# Patient Record
Sex: Male | Born: 1965 | Race: Black or African American | Hispanic: No | Smoking: Current every day smoker
Health system: Southern US, Community
[De-identification: ages and names within clinical notes are randomized; demographics above are authoritative.]

## PROBLEM LIST (undated history)

## (undated) DIAGNOSIS — R2242 Localized swelling, mass and lump, left lower limb: Secondary | ICD-10-CM

## (undated) DIAGNOSIS — R5383 Other fatigue: Secondary | ICD-10-CM

## (undated) DIAGNOSIS — R42 Dizziness and giddiness: Secondary | ICD-10-CM

## (undated) DIAGNOSIS — I10 Essential (primary) hypertension: Secondary | ICD-10-CM

## (undated) HISTORY — PX: LEG SURGERY: SHX1003

## (undated) HISTORY — DX: Localized swelling, mass and lump, left lower limb: R22.42

## (undated) HISTORY — DX: Dizziness and giddiness: R42

## (undated) HISTORY — DX: Other fatigue: R53.83

---

## 2007-12-15 ENCOUNTER — Inpatient Hospital Stay (HOSPITAL_COMMUNITY): Admission: EM | Admit: 2007-12-15 | Discharge: 2007-12-21 | Payer: Self-pay | Admitting: Emergency Medicine

## 2009-09-02 ENCOUNTER — Emergency Department (HOSPITAL_COMMUNITY): Admission: EM | Admit: 2009-09-02 | Discharge: 2009-09-02 | Payer: Self-pay | Admitting: Emergency Medicine

## 2010-03-10 ENCOUNTER — Observation Stay (HOSPITAL_COMMUNITY): Admission: EM | Admit: 2010-03-10 | Discharge: 2010-03-10 | Payer: Self-pay | Admitting: Emergency Medicine

## 2010-09-25 LAB — CBC
HCT: 40.8 % (ref 39.0–52.0)
Hemoglobin: 14.2 g/dL (ref 13.0–17.0)
MCH: 29.8 pg (ref 26.0–34.0)
RDW: 13.7 % (ref 11.5–15.5)
WBC: 7.6 10*3/uL (ref 4.0–10.5)

## 2010-09-25 LAB — BASIC METABOLIC PANEL
BUN: 15 mg/dL (ref 6–23)
CO2: 25 mEq/L (ref 19–32)
Chloride: 105 mEq/L (ref 96–112)
Creatinine, Ser: 0.94 mg/dL (ref 0.4–1.5)
Potassium: 3.6 mEq/L (ref 3.5–5.1)
Sodium: 137 mEq/L (ref 135–145)

## 2010-09-25 LAB — POCT CARDIAC MARKERS
CKMB, poc: 1.5 ng/mL (ref 1.0–8.0)
Myoglobin, poc: 79.2 ng/mL (ref 12–200)
Myoglobin, poc: 83.7 ng/mL (ref 12–200)
Troponin i, poc: <0.05 ng/mL (ref 0.00–0.09)
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
## 2011-04-08 LAB — COMPREHENSIVE METABOLIC PANEL
ALT: 37
BUN: 7
Calcium: 9.5
Chloride: 101
Creatinine, Ser: 1.14
GFR calc non Af Amer: >60
Glucose, Bld: 132 — ABNORMAL HIGH
Sodium: 139

## 2011-04-08 LAB — DIFFERENTIAL
Eosinophils Absolute: 0.1
Lymphocytes Relative: 15
Neutro Abs: 9.3 — ABNORMAL HIGH

## 2011-04-08 LAB — CBC
HCT: 46.3
Hemoglobin: 15.7
RBC: 5.18
RDW: 14.6

## 2011-10-24 ENCOUNTER — Emergency Department (HOSPITAL_COMMUNITY)
Admission: EM | Admit: 2011-10-24 | Discharge: 2011-10-24 | Disposition: A | Discharge status: Home / Self Care | Payer: Self-pay | Attending: Emergency Medicine | Admitting: Emergency Medicine

## 2011-10-24 ENCOUNTER — Encounter (HOSPITAL_COMMUNITY): Payer: Self-pay | Admitting: *Deleted

## 2011-10-24 ENCOUNTER — Emergency Department (HOSPITAL_COMMUNITY): Payer: Self-pay

## 2011-10-24 DIAGNOSIS — R109 Unspecified abdominal pain: Secondary | ICD-10-CM | POA: Insufficient documentation

## 2011-10-24 DIAGNOSIS — I10 Essential (primary) hypertension: Secondary | ICD-10-CM | POA: Insufficient documentation

## 2011-10-24 DIAGNOSIS — K649 Unspecified hemorrhoids: Secondary | ICD-10-CM | POA: Insufficient documentation

## 2011-10-24 HISTORY — DX: Essential (primary) hypertension: I10

## 2011-10-24 MED ORDER — POLYETHYLENE GLYCOL 3350 17 GM/SCOOP PO POWD: 17.0000 g | Freq: Every day | ORAL | Status: AC

## 2011-10-24 NOTE — Discharge Instructions (Signed)
Abdominal Pain Abdominal pain can be caused by many things. Your caregiver decides the seriousness of your pain by an examination and possibly blood tests and X-rays. Many cases can be observed and treated at home. Most abdominal pain is not caused by a disease and will probably improve without treatment. However, in many cases, more time must pass before a clear cause of the pain can be found. Before that point, it may not be known if you need more testing, or if hospitalization or surgery is needed. HOME CARE INSTRUCTIONS   Do not take laxatives unless directed by your caregiver.   Take pain medicine only as directed by your caregiver.   Only take over-the-counter or prescription medicines for pain, discomfort, or fever as directed by your caregiver.   Try a clear liquid diet (broth, tea, or water) for as long as directed by your caregiver. Slowly move to a bland diet as tolerated.  SEEK IMMEDIATE MEDICAL CARE IF:   The pain does not go away.   You have a fever.   You keep throwing up (vomiting).   The pain is felt only in portions of the abdomen. Pain in the right side could possibly be appendicitis. In an adult, pain in the left lower portion of the abdomen could be colitis or diverticulitis.   You pass bloody or black tarry stools.  MAKE SURE YOU:   Understand these instructions.   Will watch your condition.   Will get help right away if you are not doing well or get worse.  Document Released: 04/07/2005 Document Revised: 06/17/2011 Document Reviewed: 02/14/2008 [facility] Patient Information 2012 [place], [place].Hemorrhoids Hemorrhoids are enlarged (dilated) veins around the rectum. There are 2 types of hemorrhoids, and the type of hemorrhoid is determined by its location. Internal hemorrhoids occur in the veins just inside the rectum.They are usually not painful, but they may bleed.However, they may poke through to the outside and become irritated and painful. External  hemorrhoids involve the veins outside the anus and can be felt as a painful swelling or hard lump near the anus.They are often itchy and may crack and bleed. Sometimes clots will form in the veins. This makes them swollen and painful. These are called thrombosed hemorrhoids. CAUSES Causes of hemorrhoids include:  Pregnancy. This increases the pressure in the hemorrhoidal veins.   Constipation.   Straining to have a bowel movement.   Obesity.   Heavy lifting or other activity that caused you to strain.  TREATMENT Most of the time hemorrhoids improve in 1 to 2 weeks. However, if symptoms do not seem to be getting better or if you have a lot of rectal bleeding, your caregiver may perform a procedure to help make the hemorrhoids get smaller or remove them completely.Possible treatments include:  Rubber band ligation. A rubber band is placed at the base of the hemorrhoid to cut off the circulation.   Sclerotherapy. A chemical is injected to shrink the hemorrhoid.   Infrared light therapy. Tools are used to burn the hemorrhoid.   Hemorrhoidectomy. This is surgical removal of the hemorrhoid.  HOME CARE INSTRUCTIONS   Increase fiber in your diet. Ask your caregiver about using fiber supplements.   Drink enough water and fluids to keep your urine clear or pale yellow.   Exercise regularly.   Go to the bathroom when you have the urge to have a bowel movement. Do not wait.   Avoid straining to have bowel movements.   Keep the anal area dry and  clean.   Only take over-the-counter or prescription medicines for pain, discomfort, or fever as directed by your caregiver.  If your hemorrhoids are thrombosed:  Take warm sitz baths for 20 to 30 minutes, 3 to 4 times per day.   If the hemorrhoids are very tender and swollen, place ice packs on the area as tolerated. Using ice packs between sitz baths may be helpful. Fill a plastic bag with ice. Place a towel between the bag of ice and your  skin.   Medicated creams and suppositories may be used or applied as directed.   Do not use a donut-shaped pillow or sit on the toilet for long periods. This increases blood pooling and pain.  SEEK MEDICAL CARE IF:   You have increasing pain and swelling that is not controlled with your medicine.   You have uncontrolled bleeding.   You have difficulty or you are unable to have a bowel movement.   You have pain or inflammation outside the area of the hemorrhoids.   You have chills or an oral temperature above 102 F (38.9 C).  MAKE SURE YOU:   Understand these instructions.   Will watch your condition.   Will get help right away if you are not doing well or get worse.  Document Released: 06/25/2000 Document Revised: 06/17/2011 Document Reviewed: 10/31/2007 [facility] Patient Information 2012 [place], [place].

## 2011-10-24 NOTE — ED Notes (Signed)
Bilateral lower belly pain, and knot on anal area. Duration x 1 week.

## 2011-10-24 NOTE — ED Provider Notes (Signed)
History     CSN: [id number]  Arrival date & time 10/24/11  1432   First MD Initiated Contact with Patient 10/24/11 1646      No chief complaint on file.   (Consider location/radiation/quality/duration/timing/severity/associated sxs/prior treatment) The history is provided by the patient.   patient's abdominal pain on and off the last few weeks. States it comes and goes. It is not worse with food or eating. It does not change with bowel movements. No diarrhea constipation. No nausea vomiting. No trauma. No rash. He also states he hasn't bump in his anal area. Assessment 1 on for about a week. Is not tender. No bleeding. No change in his bowel habits. He has not had this before.  Past Medical History  Diagnosis Date  . Hypertension     No past surgical history on file.  No family history on file.  History  Substance Use Topics  . Smoking status: Current Everyday Smoker  . Smokeless tobacco: Not on file  . Alcohol Use: Yes      Review of Systems  Constitutional: Negative for activity change and appetite change.  HENT: Negative for neck stiffness.   Eyes: Negative for pain.  Respiratory: Negative for chest tightness and shortness of breath.   Cardiovascular: Negative for chest pain and leg swelling.  Gastrointestinal: Positive for abdominal pain. Negative for nausea, vomiting and diarrhea.  Genitourinary: Negative for flank pain.  Musculoskeletal: Negative for back pain.  Skin: Negative for rash.  Neurological: Negative for weakness, numbness and headaches.  Psychiatric/Behavioral: Negative for behavioral problems.    Allergies  Review of patient's allergies indicates no known allergies.  Home Medications   Current Outpatient Rx  Name Route Sig Dispense Refill  . POLYETHYLENE GLYCOL 3350 PO POWD Oral Take 17 g by mouth daily. 255 g 0    BP 138/84  Pulse 79  Temp(Src) 98.2 F (36.8 C) (Oral)  Resp 12  SpO2 98%  Physical Exam  Nursing note and vitals  reviewed. Constitutional: He is oriented to person, place, and time. He appears well-developed and well-nourished.  HENT:  Head: Normocephalic and atraumatic.  Eyes: EOM are normal. Pupils are equal, round, and reactive to light.  Neck: Normal range of motion. Neck supple.  Cardiovascular: Normal rate, regular rhythm and normal heart sounds.   No murmur heard. Pulmonary/Chest: Effort normal and breath sounds normal.  Abdominal: Soft. Bowel sounds are normal. He exhibits no distension and no mass. There is no tenderness. There is no rebound and no guarding.  Genitourinary:       Patient has one approximately 1 cm thrombosed hemorrhoid versus mass. It is more of a white color. It is located anterior to the anus  Musculoskeletal: Normal range of motion. He exhibits no edema.  Neurological: He is alert and oriented to person, place, and time. No cranial nerve deficit.  Skin: Skin is warm and dry.  Psychiatric: He has a normal mood and affect.    ED Course  Procedures (including critical care time)  Labs Reviewed - No data to display Dg Abd 2 Views  10/24/2011  *RADIOLOGY REPORT*  Clinical Data: Bilateral abdominal pain for 2 months.  ABDOMEN - 2 VIEW  Comparison: None.  Findings: The bowel gas pattern is normal.  There is no evidence of free intraperitoneal air.  Faint vascular calcification is noted in the left pelvis.  There are no suspicious pelvic calcifications. There are possible postsurgical changes in the lower lumbar spine. Calcification is noted adjacent to the  left ischial tuberosity.  IMPRESSION: No acute abdominal findings.  Original Report Authenticated By: Gerrianne Scale, M.D.     1. Abdominal pain   2. Hemorrhoid       MDM  Patient with lower abdominal pain. Comes and goes. Possible mild constipation x-ray. Benign exam. Also has a mass in the area. Possible hemorrhoid, but the color is a little bit whiter than I would  expect. Patient will follow up with general  surgery        Juliet Rude. Rubin Payor, MD 10/24/11 [phone]

## 2013-02-12 ENCOUNTER — Emergency Department (HOSPITAL_COMMUNITY)
Admission: EM | Admit: 2013-02-12 | Discharge: 2013-02-13 | Disposition: A | Discharge status: Home / Self Care | Payer: No Typology Code available for payment source | Attending: Emergency Medicine | Admitting: Emergency Medicine

## 2013-02-12 ENCOUNTER — Encounter (HOSPITAL_COMMUNITY): Payer: Self-pay

## 2013-02-12 DIAGNOSIS — IMO0002 Reserved for concepts with insufficient information to code with codable children: Secondary | ICD-10-CM | POA: Insufficient documentation

## 2013-02-12 DIAGNOSIS — Y92410 Unspecified street and highway as the place of occurrence of the external cause: Secondary | ICD-10-CM | POA: Insufficient documentation

## 2013-02-12 DIAGNOSIS — S0993XA Unspecified injury of face, initial encounter: Secondary | ICD-10-CM | POA: Insufficient documentation

## 2013-02-12 DIAGNOSIS — S298XXA Other specified injuries of thorax, initial encounter: Secondary | ICD-10-CM | POA: Insufficient documentation

## 2013-02-12 DIAGNOSIS — I10 Essential (primary) hypertension: Secondary | ICD-10-CM | POA: Insufficient documentation

## 2013-02-12 DIAGNOSIS — Y939 Activity, unspecified: Secondary | ICD-10-CM | POA: Insufficient documentation

## 2013-02-12 MED ORDER — CYCLOBENZAPRINE HCL 10 MG PO TABS
5.0000 mg | ORAL_TABLET | Freq: Once | ORAL | Status: AC
  Administered 2013-02-13: 5 mg via ORAL
  Filled 2013-02-12: qty 1

## 2013-02-12 MED ORDER — IBUPROFEN 400 MG PO TABS
800.0000 mg | ORAL_TABLET | Freq: Once | ORAL | Status: AC
  Administered 2013-02-13: 800 mg via ORAL
  Filled 2013-02-12: qty 2
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
## 2013-02-13 ENCOUNTER — Emergency Department (HOSPITAL_COMMUNITY): Payer: No Typology Code available for payment source

## 2013-02-13 MED ORDER — CYCLOBENZAPRINE HCL 5 MG PO TABS: 5.0000 mg | ORAL_TABLET | Freq: Three times a day (TID) | ORAL | Status: DC | PRN

## 2013-02-13 MED ORDER — IBUPROFEN 800 MG PO TABS: 800.0000 mg | ORAL_TABLET | Freq: Three times a day (TID) | ORAL | Status: DC | PRN
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
## 2013-02-13 NOTE — ED Provider Notes (Signed)
Medical screening examination/treatment/procedure(s) were performed by non-physician practitioner and as supervising physician I was immediately available for consultation/collaboration.  Olivia Mackie, MD 02/13/13 [phone]

## 2013-02-13 NOTE — ED Notes (Signed)
Pt to xray at this time.
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
## 2015-01-18 ENCOUNTER — Emergency Department (HOSPITAL_COMMUNITY)
Admission: EM | Admit: 2015-01-18 | Discharge: 2015-01-19 | Disposition: A | Discharge status: Home / Self Care | Payer: Self-pay | Attending: Emergency Medicine | Admitting: Emergency Medicine

## 2015-01-18 ENCOUNTER — Emergency Department (HOSPITAL_COMMUNITY): Payer: No Typology Code available for payment source

## 2015-01-18 ENCOUNTER — Emergency Department (HOSPITAL_COMMUNITY): Payer: Self-pay

## 2015-01-18 ENCOUNTER — Encounter (HOSPITAL_COMMUNITY): Payer: Self-pay

## 2015-01-18 DIAGNOSIS — T07XXXA Unspecified multiple injuries, initial encounter: Secondary | ICD-10-CM

## 2015-01-18 DIAGNOSIS — S01511A Laceration without foreign body of lip, initial encounter: Secondary | ICD-10-CM | POA: Insufficient documentation

## 2015-01-18 DIAGNOSIS — Y92410 Unspecified street and highway as the place of occurrence of the external cause: Secondary | ICD-10-CM | POA: Insufficient documentation

## 2015-01-18 DIAGNOSIS — Y9389 Activity, other specified: Secondary | ICD-10-CM | POA: Insufficient documentation

## 2015-01-18 DIAGNOSIS — S199XXA Unspecified injury of neck, initial encounter: Secondary | ICD-10-CM | POA: Insufficient documentation

## 2015-01-18 DIAGNOSIS — I10 Essential (primary) hypertension: Secondary | ICD-10-CM | POA: Insufficient documentation

## 2015-01-18 DIAGNOSIS — S80211A Abrasion, right knee, initial encounter: Secondary | ICD-10-CM | POA: Insufficient documentation

## 2015-01-18 DIAGNOSIS — Y998 Other external cause status: Secondary | ICD-10-CM | POA: Insufficient documentation

## 2015-01-18 DIAGNOSIS — S20311A Abrasion of right front wall of thorax, initial encounter: Secondary | ICD-10-CM | POA: Insufficient documentation

## 2015-01-18 DIAGNOSIS — S40211A Abrasion of right shoulder, initial encounter: Secondary | ICD-10-CM | POA: Insufficient documentation

## 2015-01-18 DIAGNOSIS — S0990XA Unspecified injury of head, initial encounter: Secondary | ICD-10-CM | POA: Insufficient documentation

## 2015-01-18 DIAGNOSIS — Z23 Encounter for immunization: Secondary | ICD-10-CM | POA: Insufficient documentation

## 2015-01-18 DIAGNOSIS — S81011A Laceration without foreign body, right knee, initial encounter: Secondary | ICD-10-CM | POA: Insufficient documentation

## 2015-01-18 DIAGNOSIS — Z720 Tobacco use: Secondary | ICD-10-CM | POA: Insufficient documentation

## 2015-01-18 DIAGNOSIS — T1490XA Injury, unspecified, initial encounter: Secondary | ICD-10-CM

## 2015-01-18 LAB — COMPREHENSIVE METABOLIC PANEL
ALK PHOS: 59 U/L (ref 38–126)
ALT: 39 U/L (ref 17–63)
AST: 50 U/L — ABNORMAL HIGH (ref 15–41)
Albumin: 4.2 g/dL (ref 3.5–5.0)
Anion gap: 17 — ABNORMAL HIGH (ref 5–15)
BILIRUBIN TOTAL: 0.5 mg/dL (ref 0.3–1.2)
BUN: 13 mg/dL (ref 6–20)
CHLORIDE: 100 mmol/L — AB (ref 101–111)
CO2: 19 mmol/L — AB (ref 22–32)
Calcium: 8.8 mg/dL — ABNORMAL LOW (ref 8.9–10.3)
Creatinine, Ser: 1.15 mg/dL (ref 0.61–1.24)
GFR calc non Af Amer: >60 mL/min (ref >60)
GLUCOSE: 82 mg/dL (ref 65–99)
POTASSIUM: 4 mmol/L (ref 3.5–5.1)
SODIUM: 136 mmol/L (ref 135–145)
TOTAL PROTEIN: 7.8 g/dL (ref 6.5–8.1)

## 2015-01-18 LAB — CBC
HCT: 43.3 % (ref 39.0–52.0)
Hemoglobin: 14.9 g/dL (ref 13.0–17.0)
MCH: 30 pg (ref 26.0–34.0)
MCHC: 34.4 g/dL (ref 30.0–36.0)
MCV: 87.3 fL (ref 78.0–100.0)
PLATELETS: 352 10*3/uL (ref 150–400)
RBC: 4.96 MIL/uL (ref 4.22–5.81)
RDW: 15.1 % (ref 11.5–15.5)
WBC: 5.8 10*3/uL (ref 4.0–10.5)

## 2015-01-18 LAB — ETHANOL: ALCOHOL ETHYL (B): 345 mg/dL — AB (ref <5)

## 2015-01-18 MED ORDER — SODIUM CHLORIDE 0.9 % IV BOLUS (SEPSIS)
1000.0000 mL | Freq: Once | INTRAVENOUS | Status: AC
  Administered 2015-01-18: 1000 mL via INTRAVENOUS

## 2015-01-18 MED ORDER — TETANUS-DIPHTH-ACELL PERTUSSIS 5-2.5-18.5 LF-MCG/0.5 IM SUSP
0.5000 mL | Freq: Once | INTRAMUSCULAR | Status: AC
  Administered 2015-01-18: 0.5 mL via INTRAMUSCULAR
  Filled 2015-01-18: qty 0.5
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
## 2015-12-09 ENCOUNTER — Encounter (HOSPITAL_COMMUNITY): Payer: Self-pay | Admitting: *Deleted

## 2015-12-09 ENCOUNTER — Emergency Department (HOSPITAL_COMMUNITY)
Admission: EM | Admit: 2015-12-09 | Discharge: 2015-12-09 | Disposition: A | Discharge status: Home / Self Care | Payer: No Typology Code available for payment source | Attending: Emergency Medicine | Admitting: Emergency Medicine

## 2015-12-09 DIAGNOSIS — B353 Tinea pedis: Secondary | ICD-10-CM | POA: Insufficient documentation

## 2015-12-09 DIAGNOSIS — L301 Dyshidrosis [pompholyx]: Secondary | ICD-10-CM | POA: Insufficient documentation

## 2015-12-09 DIAGNOSIS — I10 Essential (primary) hypertension: Secondary | ICD-10-CM | POA: Insufficient documentation

## 2015-12-09 DIAGNOSIS — F17210 Nicotine dependence, cigarettes, uncomplicated: Secondary | ICD-10-CM | POA: Insufficient documentation

## 2015-12-09 MED ORDER — CEPHALEXIN 500 MG PO CAPS: 500.0000 mg | ORAL_CAPSULE | Freq: Four times a day (QID) | ORAL | Status: DC

## 2015-12-09 MED ORDER — TRIAMCINOLONE ACETONIDE 0.1 % EX CREA: 1.0000 "application " | TOPICAL_CREAM | Freq: Two times a day (BID) | CUTANEOUS | Status: DC

## 2015-12-09 MED ORDER — TERBINAFINE HCL 1 % EX CREA: TOPICAL_CREAM | Freq: Two times a day (BID) | CUTANEOUS | Status: DC

## 2015-12-09 NOTE — ED Notes (Signed)
Pt c/o "athelete's foot". Hx of same, chronically. Open wounds to bilateral fore feet/toes. Odor noted. Also, c/o of bilateral itching rash to arms, hands, web spaces.

## 2015-12-09 NOTE — ED Provider Notes (Signed)
CSN: [id number]     Arrival date & time 12/09/15  [id number] History  By signing my name below, I, Tanda RockersMargaux Venter, attest that this documentation has been prepared under the direction and in the presence of Wells FargoKelly Azya Barbero, PA-C.  Electronically Signed: Tanda RockersMargaux Venter, ED Scribe. 12/09/2015. 9:54 AM.   Chief Complaint  Patient presents with  . Foot Pain   The history is provided by the patient. No language interpreter was used.   HPI Comments: Mark GarbeWilliam Lawrence is a 50 y.o. male with PMHx HTN, and recurrent fungal infections who presents to the Emergency Department complaining of gradual onset, constant, pain and itching to bilateral feet x a couple of months. Pt has scaling to the toes diffusely as well as swelling to the feet. He admits to his feet sweating while wearing boots at work. Pt has attempted Tinactin cream for the past couple of months without relief. He reports hx of recurrent fungal infections to the feet that have been treated with Lamisil in the past. He also complains of a rash to the bilateral upper extremities x 5 days. Reports associated itchiness. No known exposure to poison ivy. Denies fever, chills, weakness, numbness, tingling, or any other associated symptoms. No hx DM.    Past Medical History  Diagnosis Date  . Hypertension    Past Surgical History  Procedure Laterality Date  . Leg surgery Right    No family history on file. Social History  Substance Use Topics  . Smoking status: Current Every Day Smoker -- 0.50 packs/day    Types: Cigarettes  . Smokeless tobacco: None  . Alcohol Use: 8.4 oz/week    14 Cans of beer per week    Review of Systems  Constitutional: Negative for fever and chills.  Musculoskeletal: Positive for joint swelling and arthralgias.  Skin: Positive for rash.  Neurological: Negative for weakness and numbness.   Allergies  Review of patient's allergies indicates no known allergies.  Home Medications   Prior to Admission medications    Medication Sig Start Date End Date Taking? Authorizing Provider  cyclobenzaprine (FLEXERIL) 5 MG tablet Take 1 tablet (5 mg total) by mouth 3 (three) times daily as needed for muscle spasms. Patient not taking: Reported on 01/18/2015 02/13/13   Earley FavorGail Schulz, NP  ibuprofen (ADVIL,MOTRIN) 800 MG tablet Take 1 tablet (800 mg total) by mouth every 8 (eight) hours as needed for pain. Patient not taking: Reported on 01/18/2015 02/13/13   Earley FavorGail Schulz, NP   BP 105/92 mmHg  Pulse 76  Temp(Src) 98.9 F (37.2 C) (Oral)  Resp 22  SpO2 98%   Physical Exam  Constitutional: He is oriented to person, place, and time. He appears well-developed and well-nourished. No distress.  HENT:  Head: Normocephalic and atraumatic.  Eyes: Conjunctivae are normal. Pupils are equal, round, and reactive to light. Right eye exhibits no discharge. Left eye exhibits no discharge. No scleral icterus.  Neck: Normal range of motion.  Pulmonary/Chest: Effort normal. No respiratory distress.  Neurological: He is alert and oriented to person, place, and time.  Skin: Skin is warm and dry. Rash noted.  Tapioca rash to bilateral hands extending up bilateral forearms.   Macerated, erythematous bilateral feet. N/V intact. FROM of ankles and toes bilaterally.  Psychiatric: He has a normal mood and affect.    ED Course  Procedures (including critical care time)  DIAGNOSTIC STUDIES: Oxygen Saturation is 98% on RA, normal by my interpretation.    COORDINATION OF CARE: 9:50 AM-Discussed treatment plan which  includes which includes Rx antibiotics, antifungal, and steroid cream with pt at bedside and pt agreed to plan.    MDM   Final diagnoses:  Tinea pedis of both feet  Dyshidrosis   I personally performed the services described in this documentation, which was scribed in my presence. The recorded information has been reviewed and is accurate.  50 year old male who presents with athlete's foot and rash on bilateral hands which  appears to be consistent with dyshidrosis. He is afebrile, not tachycardic or tachypneic, normotensive, and not hypoxic. Discussed plan to treat feet with Lamisil, Keflex for possible secondary infection, and steroid cream for rash on arms. Advised to keep feel as clean and dry as possible and avoid shoes and socks as much as possible. Also provided podiatry follow up and discussed that he would most likely need oral antifungals and we would not be able to rx this as they need close follow up of LFTs. Patient is NAD, non-toxic, with stable VS. Patient is informed of clinical course, understands medical decision making process, and agrees with plan. Opportunity for questions provided and all questions answered. Return precautions given.       Bethel Born, PA-C 12/09/15 1109  Doug Sou, MD 12/09/15 [phone]

## 2015-12-09 NOTE — ED Notes (Signed)
Pt c/o bil foot swelling with hx of athlete's feet, pt has multiple open areas of the feet, no drainage  Noted at this time, feet appear slightly swollen, pt denies DM, pt c/o bil arm rash onset x 5 days that itches with all skin intact, pt ambulatory, A&O x4

## 2015-12-09 NOTE — ED Notes (Signed)
Pt requesting work note for Kerr-McGeetonight.

## 2015-12-09 NOTE — ED Notes (Signed)
PA in assessing pt.

## 2015-12-09 NOTE — Discharge Instructions (Signed)

## 2015-12-13 ENCOUNTER — Emergency Department (HOSPITAL_COMMUNITY)
Admission: EM | Admit: 2015-12-13 | Discharge: 2015-12-13 | Disposition: A | Discharge status: Home / Self Care | Payer: No Typology Code available for payment source | Attending: Emergency Medicine | Admitting: Emergency Medicine

## 2015-12-13 ENCOUNTER — Encounter (HOSPITAL_COMMUNITY): Payer: Self-pay | Admitting: Emergency Medicine

## 2015-12-13 DIAGNOSIS — I10 Essential (primary) hypertension: Secondary | ICD-10-CM | POA: Insufficient documentation

## 2015-12-13 DIAGNOSIS — Z79899 Other long term (current) drug therapy: Secondary | ICD-10-CM | POA: Insufficient documentation

## 2015-12-13 DIAGNOSIS — F17210 Nicotine dependence, cigarettes, uncomplicated: Secondary | ICD-10-CM | POA: Insufficient documentation

## 2015-12-13 DIAGNOSIS — B353 Tinea pedis: Secondary | ICD-10-CM | POA: Insufficient documentation

## 2015-12-13 DIAGNOSIS — Z792 Long term (current) use of antibiotics: Secondary | ICD-10-CM | POA: Insufficient documentation

## 2015-12-13 LAB — CBG MONITORING, ED: GLUCOSE-CAPILLARY: 103 mg/dL — AB (ref 65–99)

## 2015-12-13 MED ORDER — SALICYLIC ACID 6 % EX GEL: Freq: Every day | CUTANEOUS | Status: DC

## 2015-12-13 MED ORDER — TERBINAFINE HCL 250 MG PO TABS: 250.0000 mg | ORAL_TABLET | Freq: Every day | ORAL | Status: DC

## 2015-12-13 NOTE — ED Notes (Signed)
  CBG 103  

## 2015-12-13 NOTE — ED Provider Notes (Signed)
CSN: [id number]     Arrival date & time 12/13/15  7829 History   First MD Initiated Contact with Patient 12/13/15 [phone]     Chief Complaint  Patient presents with  . Rash  . Tinea Pedis     (Consider location/radiation/quality/duration/timing/severity/associated sxs/prior Treatment) HPI 50 y.o. Male complaining of foot irritation.  Patient was seen and treated with lamisil cream and keflex 6 days ago.  He has been working and his feet are painful at night when he Is working and has to wear his socks and boots. He has been taking his medication as prescribed. He has not had any fever or chills. He also has some rash on the back of his hands. He has been told in the past this was similar to his foot infection. He denies any headache, fever, chest pain, nausea, vomiting, or diarrhea. He has had this multiple times in the past. He denies any history of diabetes. Past Medical History  Diagnosis Date  . Hypertension    Past Surgical History  Procedure Laterality Date  . Leg surgery Right    No family history on file. Social History  Substance Use Topics  . Smoking status: Current Every Day Smoker -- 0.50 packs/day    Types: Cigarettes  . Smokeless tobacco: None  . Alcohol Use: 8.4 oz/week    14 Cans of beer per week    Review of Systems  All other systems reviewed and are negative.     Allergies  Review of patient's allergies indicates no known allergies.  Home Medications   Prior to Admission medications   Medication Sig Start Date End Date Taking? Authorizing Provider  cephALEXin (KEFLEX) 500 MG capsule Take 1 capsule (500 mg total) by mouth 4 (four) times daily. 12/09/15   Bethel Born, PA-C  salicylic acid 6 % gel Apply topically daily. 12/13/15   Margarita Grizzle, MD  terbinafine (LAMISIL AT) 1 % cream Apply topically 2 (two) times daily. Apply to feet and around fingernails only 12/09/15   Bethel Born, PA-C  terbinafine (LAMISIL) 250 MG tablet Take 1 tablet (250 mg  total) by mouth daily. 12/13/15   Margarita Grizzle, MD  triamcinolone cream (KENALOG) 0.1 % Apply 1 application topically 2 (two) times daily. Apply to hands and arms only 12/09/15   Bethel Born, PA-C   BP 148/110 mmHg  Pulse 70  Temp(Src) 98.6 F (37 C) (Oral)  Resp 16  SpO2 100% Physical Exam  Constitutional: He is oriented to person, place, and time. He appears well-developed.  HENT:  Head: Normocephalic and atraumatic.  Right Ear: External ear normal.  Left Ear: External ear normal.  Nose: Nose normal.  Eyes: EOM are normal.  Neck: No tracheal deviation present.  Pulmonary/Chest: Effort normal.  Musculoskeletal: Normal range of motion.  Neurological: He is alert and oriented to person, place, and time.  Skin: Skin is warm and dry.  Skin on feet is thickened reddened with scaling and flaking There are some lumps on the back of his hand.  Psychiatric: He has a normal mood and affect. His behavior is normal.  Nursing note and vitals reviewed.   ED Course  Procedures (including critical care time) Labs Review Labs Reviewed - No data to display  Imaging Review No results found. I have personally reviewed and evaluated these images and lab results as part of my medical decision-making.   EKG Interpretation None      MDM   Final diagnoses:  Tinea pedis of  both feet  Patient's exam is consistent with tinea pedis. He has been using topical creams and has not had improvement. He is started on terbinafine 250 mg tablets 1 by mouth daily for 14 days and given a prescription for salicylic acid gel to be used twice a day. He is advised to keep his feet clean and dry and open to the air. He is advised not to work for the next week until his feet can improve. He has a history of hypertension and is hypertensive here. No associated symptoms with hypertension. He is advised that he needs to obtain primary care follow-up regarding both of these conditions and voices  understanding.  Margarita Grizzleanielle Shaylon Aden, MD 12/13/15 [phone]

## 2015-12-13 NOTE — ED Notes (Signed)
EDP at bedside  

## 2015-12-13 NOTE — Discharge Instructions (Signed)
Apply salicylic acid gel to feet two times daily.  Keep feet clean and dry.  Do not wear shoes or socks until resolved.  Recheck with md next week.   Athlete's Foot Athlete's foot (tinea pedis) is a fungal infection of the skin on the feet. It often occurs on the skin between the toes or underneath the toes. It can also occur on the soles of the feet. Athlete's foot is more likely to occur in hot, humid weather. Not washing your feet or changing your socks often enough can contribute to athlete's foot. The infection can spread from person to person (contagious). CAUSES Athlete's foot is caused by a fungus. This fungus thrives in warm, moist places. Most people get athlete's foot by sharing shower stalls, towels, and wet floors with an infected person. People with weakened immune systems, including those with diabetes, may be more likely to get athlete's foot. SYMPTOMS   Itchy areas between the toes or on the soles of the feet.  White, flaky, or scaly areas between the toes or on the soles of the feet.  Tiny, intensely itchy blisters between the toes or on the soles of the feet.  Tiny cuts on the skin. These cuts can develop a bacterial infection.  Thick or discolored toenails. DIAGNOSIS  Your caregiver can usually tell what the problem is by doing a physical exam. Your caregiver may also take a skin sample from the rash area. The skin sample may be examined under a microscope, or it may be tested to see if fungus will grow in the sample. A sample may also be taken from your toenail for testing. TREATMENT  Over-the-counter and prescription medicines can be used to kill the fungus. These medicines are available as powders or creams. Your caregiver can suggest medicines for you. Fungal infections respond slowly to treatment. You may need to continue using your medicine for several weeks. PREVENTION   Do not share towels.  Wear sandals in wet areas, such as shared locker rooms and shared  showers.  Keep your feet dry. Wear shoes that allow air to circulate. Wear cotton or wool socks. HOME CARE INSTRUCTIONS   Take medicines as directed by your caregiver. Do not use steroid creams on athlete's foot.  Keep your feet clean and cool. Wash your feet daily and dry them thoroughly, especially between your toes.  Change your socks every day. Wear cotton or wool socks. In hot climates, you may need to change your socks 2 to 3 times per day.  Wear sandals or canvas tennis shoes with good air circulation.  If you have blisters, soak your feet in Burow's solution or Epsom salts for 20 to 30 minutes, 2 times a day to dry out the blisters. Make sure you dry your feet thoroughly afterward. SEEK MEDICAL CARE IF:   You have a fever.  You have swelling, soreness, warmth, or redness in your foot.  You are not getting better after 7 days of treatment.  You are not completely cured after 30 days.  You have any problems caused by your medicines. MAKE SURE YOU:   Understand these instructions.  Will watch your condition.  Will get help right away if you are not doing well or get worse.   This information is not intended to replace advice given to you by your health care provider. Make sure you discuss any questions you have with your health care provider.   Document Released: 06/25/2000 Document Revised: 09/20/2011 Document Reviewed: 12/30/2014 Elsevier Interactive  Patient Education 2016 Elsevier Inc. Hypertension Hypertension is another name for high blood pressure. High blood pressure forces your heart to work harder to pump blood. A blood pressure reading has two numbers, which includes a higher number over a lower number (example: 110/72). HOME CARE   Have your blood pressure rechecked by your doctor.  Only take medicine as told by your doctor. Follow the directions carefully. The medicine does not work as well if you skip doses. Skipping doses also puts you at risk for  problems.  Do not smoke.  Monitor your blood pressure at home as told by your doctor. GET HELP IF:  You think you are having a reaction to the medicine you are taking.  You have repeat headaches or feel dizzy.  You have puffiness (swelling) in your ankles.  You have trouble with your vision. GET HELP RIGHT AWAY IF:   You get a very bad headache and are confused.  You feel weak, numb, or faint.  You get chest or belly (abdominal) pain.  You throw up (vomit).  You cannot breathe very well. MAKE SURE YOU:   Understand these instructions.  Will watch your condition.  Will get help right away if you are not doing well or get worse.   This information is not intended to replace advice given to you by your health care provider. Make sure you discuss any questions you have with your health care provider.   Document Released: 12/15/2007 Document Revised: 07/03/2013 Document Reviewed: 04/20/2013 Elsevier Interactive Patient Education [employer] Elsevier Inc.

## 2015-12-13 NOTE — ED Notes (Signed)
Patient here with Athlete's Foot on both feet with drainage.  Patient also has rash on bilateral hands and arms.

## 2015-12-22 ENCOUNTER — Encounter (HOSPITAL_COMMUNITY): Payer: Self-pay | Admitting: *Deleted

## 2015-12-22 ENCOUNTER — Emergency Department (HOSPITAL_COMMUNITY)
Admission: EM | Admit: 2015-12-22 | Discharge: 2015-12-22 | Disposition: A | Discharge status: Home / Self Care | Payer: No Typology Code available for payment source | Attending: Emergency Medicine | Admitting: Emergency Medicine

## 2015-12-22 DIAGNOSIS — B353 Tinea pedis: Secondary | ICD-10-CM | POA: Insufficient documentation

## 2015-12-22 DIAGNOSIS — F17210 Nicotine dependence, cigarettes, uncomplicated: Secondary | ICD-10-CM | POA: Insufficient documentation

## 2015-12-22 DIAGNOSIS — I10 Essential (primary) hypertension: Secondary | ICD-10-CM | POA: Insufficient documentation

## 2015-12-22 MED ORDER — TERBINAFINE HCL 250 MG PO TABS: 250.0000 mg | ORAL_TABLET | Freq: Every day | ORAL | Status: DC

## 2015-12-22 MED ORDER — SULFAMETHOXAZOLE-TRIMETHOPRIM 800-160 MG PO TABS: 1.0000 | ORAL_TABLET | Freq: Two times a day (BID) | ORAL | Status: DC

## 2015-12-22 NOTE — ED Provider Notes (Signed)
History  By signing my name below, I, Earmon Phoenix, attest that this documentation has been prepared under the direction and in the presence of Teressa Lower, FNP. Electronically Signed: Earmon Phoenix, ED Scribe. 12/22/2015. 9:27 AM.  Chief Complaint  Patient presents with  . Follow-up   The history is provided by the patient and medical records. No language interpreter was used.    HPI Comments:  Mark Lawrence is a 50 y.o. male who presents to the Emergency Department wanting a recheck of bilateral feet for irritation that began about three months ago. He states he wears steel-toed boots and his feet sweat a lot. Pt was initially treated here 15 days ago (12/09/15) with Lamisil and Keflex and was seen again nine days ago (12/13/15) and was prescribed Terbinafine and salicylic acid gel. He states he has been using his medications as directed and is still experiencing pain and states they are not healing properly. He denies modifying factors. He denies fever, chills, nausea, vomiting.  Past Medical History  Diagnosis Date  . Hypertension    Past Surgical History  Procedure Laterality Date  . Leg surgery Right    History reviewed. No pertinent family history. Social History  Substance Use Topics  . Smoking status: Current Every Day Smoker -- 0.50 packs/day    Types: Cigarettes  . Smokeless tobacco: None  . Alcohol Use: 8.4 oz/week    14 Cans of beer per week    Review of Systems  Constitutional: Negative for fever and chills.  Gastrointestinal: Negative for nausea and vomiting.  Skin: Positive for color change and wound.  All other systems reviewed and are negative.   Allergies  Review of patient's allergies indicates no known allergies.  Home Medications   Prior to Admission medications   Medication Sig Start Date End Date Taking? Authorizing Provider  cephALEXin (KEFLEX) 500 MG capsule Take 1 capsule (500 mg total) by mouth 4 (four) times daily. 12/09/15    Bethel Born, PA-C  salicylic acid 6 % gel Apply topically daily. 12/13/15   Margarita Grizzle, MD  terbinafine (LAMISIL AT) 1 % cream Apply topically 2 (two) times daily. Apply to feet and around fingernails only 12/09/15   Bethel Born, PA-C  terbinafine (LAMISIL) 250 MG tablet Take 1 tablet (250 mg total) by mouth daily. 12/13/15   Margarita Grizzle, MD  triamcinolone cream (KENALOG) 0.1 % Apply 1 application topically 2 (two) times daily. Apply to hands and arms only 12/09/15   Bethel Born, PA-C   Triage Vitals: BP 131/79 mmHg  Pulse 69  Temp(Src) 98.2 F (36.8 C) (Oral)  Resp 16  SpO2 100% Physical Exam  Constitutional: He is oriented to person, place, and time. He appears well-developed and well-nourished.  HENT:  Head: Normocephalic and atraumatic.  Eyes: EOM are normal.  Neck: Normal range of motion.  Cardiovascular: Normal rate.   Pulmonary/Chest: Effort normal.  Musculoskeletal: Normal range of motion.  Pt has wet weeping areas in between all the toes. Toes are crusted  Neurological: He is alert and oriented to person, place, and time.  Skin: Skin is warm and dry.  Psychiatric: He has a normal mood and affect. His behavior is normal.  Nursing note and vitals reviewed.   ED Course  Procedures (including critical care time) DIAGNOSTIC STUDIES: Oxygen Saturation is 100% on RA, normal by my interpretation.   COORDINATION OF CARE: 9:27 AM- Will provide work note, prescribe antifungal medications, antibiotics and refer to wound clinic. Pt verbalizes understanding  and agrees to plan.  Medications - No data to display   MDM   Final diagnoses:  Tinea pedis of both feet    Continues to have tinea pedis with breakdown. Discussed with pt follow up with wound clinic  I personally performed the services described in this documentation, which was scribed in my presence. The recorded information has been reviewed and is accurate.     Teressa LowerVrinda Lamees Gable, NP 12/22/15  1103  Mancel BaleElliott Wentz, MD 12/23/15 [phone]

## 2015-12-22 NOTE — ED Notes (Signed)
Pt presents to day for a recheck of feet. Pt asking for pain meds and an extended work note. Pt reports he has been off a week and does not feel like he can work [employer] because of foot pain.

## 2015-12-22 NOTE — ED Notes (Signed)
Declined W/C at D/C and was escorted to lobby by RN. 

## 2015-12-22 NOTE — Discharge Instructions (Signed)

## 2017-09-17 ENCOUNTER — Emergency Department (HOSPITAL_BASED_OUTPATIENT_CLINIC_OR_DEPARTMENT_OTHER)
Admission: RE | Admit: 2017-09-17 | Discharge: 2017-09-17 | Disposition: A | Discharge status: Home / Self Care | Payer: Self-pay | Source: Ambulatory Visit | Attending: Emergency Medicine | Admitting: Emergency Medicine

## 2017-09-17 ENCOUNTER — Encounter (HOSPITAL_COMMUNITY): Payer: Self-pay | Admitting: *Deleted

## 2017-09-17 ENCOUNTER — Other Ambulatory Visit: Payer: Self-pay

## 2017-09-17 ENCOUNTER — Emergency Department (HOSPITAL_COMMUNITY)
Admission: EM | Admit: 2017-09-17 | Discharge: 2017-09-17 | Disposition: A | Discharge status: Home / Self Care | Payer: Self-pay | Attending: Emergency Medicine | Admitting: Emergency Medicine

## 2017-09-17 DIAGNOSIS — B353 Tinea pedis: Secondary | ICD-10-CM | POA: Insufficient documentation

## 2017-09-17 DIAGNOSIS — I10 Essential (primary) hypertension: Secondary | ICD-10-CM | POA: Insufficient documentation

## 2017-09-17 DIAGNOSIS — L03116 Cellulitis of left lower limb: Secondary | ICD-10-CM | POA: Insufficient documentation

## 2017-09-17 DIAGNOSIS — M79609 Pain in unspecified limb: Secondary | ICD-10-CM

## 2017-09-17 DIAGNOSIS — F17210 Nicotine dependence, cigarettes, uncomplicated: Secondary | ICD-10-CM | POA: Insufficient documentation

## 2017-09-17 LAB — CBC WITH DIFFERENTIAL/PLATELET
BASOS PCT: 0 %
Basophils Absolute: 0 10*3/uL (ref 0.0–0.1)
EOS ABS: 0.1 10*3/uL (ref 0.0–0.7)
Eosinophils Relative: 2 %
HCT: 40.2 % (ref 39.0–52.0)
Hemoglobin: 14.1 g/dL (ref 13.0–17.0)
Lymphocytes Relative: 14 %
Lymphs Abs: 1.2 10*3/uL (ref 0.7–4.0)
MCH: 31.5 pg (ref 26.0–34.0)
MCHC: 35.1 g/dL (ref 30.0–36.0)
MCV: 89.9 fL (ref 78.0–100.0)
MONO ABS: 1.2 10*3/uL — AB (ref 0.1–1.0)
Monocytes Relative: 14 %
Neutro Abs: 6.3 10*3/uL (ref 1.7–7.7)
Neutrophils Relative %: 70 %
Platelets: 249 10*3/uL (ref 150–400)
RBC: 4.47 MIL/uL (ref 4.22–5.81)
RDW: 14 % (ref 11.5–15.5)
WBC: 8.9 10*3/uL (ref 4.0–10.5)

## 2017-09-17 LAB — BASIC METABOLIC PANEL
Anion gap: 12 (ref 5–15)
BUN: 9 mg/dL (ref 6–20)
CALCIUM: 8.8 mg/dL — AB (ref 8.9–10.3)
CO2: 23 mmol/L (ref 22–32)
Chloride: 100 mmol/L — ABNORMAL LOW (ref 101–111)
Creatinine, Ser: 0.99 mg/dL (ref 0.61–1.24)
GFR calc Af Amer: >60 mL/min (ref >60)
GLUCOSE: 152 mg/dL — AB (ref 65–99)
Potassium: 3.7 mmol/L (ref 3.5–5.1)
SODIUM: 135 mmol/L (ref 135–145)

## 2017-09-17 LAB — I-STAT CG4 LACTIC ACID, ED
LACTIC ACID, VENOUS: 0.48 mmol/L — AB (ref 0.5–1.9)
Lactic Acid, Venous: 0.76 mmol/L (ref 0.5–1.9)

## 2017-09-17 MED ORDER — CEPHALEXIN 500 MG PO CAPS: 1000.0000 mg | ORAL_CAPSULE | Freq: Two times a day (BID) | ORAL | 0 refills | Status: DC

## 2017-09-17 MED ORDER — TOLNAFTATE 1 % EX POWD: 1.0000 "application " | Freq: Two times a day (BID) | CUTANEOUS | 0 refills | Status: DC

## 2017-09-17 MED ORDER — SULFAMETHOXAZOLE-TRIMETHOPRIM 800-160 MG PO TABS: 1.0000 | ORAL_TABLET | Freq: Two times a day (BID) | ORAL | 0 refills | Status: AC

## 2017-09-17 MED ORDER — FLUCONAZOLE 150 MG PO TABS: ORAL_TABLET | ORAL | 0 refills | Status: DC

## 2017-09-17 MED ORDER — FLUCONAZOLE 100 MG PO TABS
200.0000 mg | ORAL_TABLET | Freq: Once | ORAL | Status: AC
  Administered 2017-09-17: 200 mg via ORAL
  Filled 2017-09-17: qty 2

## 2017-09-17 MED ORDER — SODIUM CHLORIDE 0.9 % IV SOLN
1.0000 g | Freq: Once | INTRAVENOUS | Status: AC
  Administered 2017-09-17: 1 g via INTRAVENOUS
  Filled 2017-09-17: qty 10

## 2017-09-17 NOTE — ED Provider Notes (Signed)
[facility] EMERGENCY DEPARTMENT Provider Note   CSN: [id number] Arrival date & time: 09/17/17  6213     History   Chief Complaint Chief Complaint  Patient presents with  . Leg Swelling    HPI Mark Lawrence is a 52 y.o. male.  HPI Patient started to notice pain and swelling in his left lower leg 3 days ago.  He reports since then is gotten much more swollen and red.  It is painful to the lower aspect of the leg.  He denies pain behind the knee.  He reports he has had chronic athlete's foot and is unsure if that is related.  He denies chest pain or shortness of breath.  No fever no malaise.  Patient denies any history of similar episodes.  No history of DVT.  No recent long travel or immobilization.  No recent procedures.  Patient denies any known medical history.  He does report may be a few times he has been told he has high blood pressure but has not sought regular treatment regarding this. Past Medical History:  Diagnosis Date  . Hypertension     There are no active problems to display for this patient.   Past Surgical History:  Procedure Laterality Date  . LEG SURGERY Right        Home Medications    Prior to Admission medications   Medication Sig Start Date End Date Taking? Authorizing Provider  cephALEXin (KEFLEX) 500 MG capsule Take 1 capsule (500 mg total) by mouth 4 (four) times daily. 12/09/15   Bethel Born, PA-C  cephALEXin (KEFLEX) 500 MG capsule Take 2 capsules (1,000 mg total) by mouth 2 (two) times daily. 09/17/17   Arby Barrette, MD  fluconazole (DIFLUCAN) 150 MG tablet 1 tablet every week starting on 3\16 then 3/23, 3/30, 4/6 09/17/17   Arby Barrette, MD  salicylic acid 6 % gel Apply topically daily. 12/13/15   Margarita Grizzle, MD  sulfamethoxazole-trimethoprim (BACTRIM DS,SEPTRA DS) 800-160 MG tablet Take 1 tablet by mouth 2 (two) times daily. 12/22/15   Teressa Lower, NP  sulfamethoxazole-trimethoprim (BACTRIM DS,SEPTRA DS)  800-160 MG tablet Take 1 tablet by mouth 2 (two) times daily for 10 days. 09/17/17 09/27/17  Arby Barrette, MD  terbinafine (LAMISIL AT) 1 % cream Apply topically 2 (two) times daily. Apply to feet and around fingernails only 12/09/15   Bethel Born, PA-C  terbinafine (LAMISIL) 250 MG tablet Take 1 tablet (250 mg total) by mouth daily. 12/13/15   Margarita Grizzle, MD  terbinafine (LAMISIL) 250 MG tablet Take 1 tablet (250 mg total) by mouth daily. 12/22/15   Teressa Lower, NP  tolnaftate (TINACTIN) 1 % powder Apply 1 application topically 2 (two) times daily. 09/17/17   Arby Barrette, MD  triamcinolone cream (KENALOG) 0.1 % Apply 1 application topically 2 (two) times daily. Apply to hands and arms only 12/09/15   Bethel Born, PA-C    Family History History reviewed. No pertinent family history.  Social History Social History   Tobacco Use  . Smoking status: Current Every Day Smoker    Packs/day: 0.50    Types: Cigarettes  Substance Use Topics  . Alcohol use: Yes    Alcohol/week: 8.4 oz    Types: 14 Cans of beer per week  . Drug use: Yes    Types: Marijuana     Allergies   Patient has no known allergies.   Review of Systems Review of Systems 10 Systems reviewed and are negative for  acute change except as noted in the HPI.  Physical Exam Updated Vital Signs BP (!) 151/87   Pulse 68   Temp 98.3 F (36.8 C) (Oral)   Resp 16   Ht 5\' 11"  (1.803 m)   Wt 86.2 kg (190 lb)   SpO2 100%   BMI 26.50 kg/m   Physical Exam  Constitutional: He is oriented to person, place, and time. He appears well-developed and well-nourished.  HENT:  Head: Normocephalic and atraumatic.  Eyes: Conjunctivae are normal.  Neck: Neck supple.  Cardiovascular: Normal rate, regular rhythm, normal heart sounds and intact distal pulses.  No murmur heard. Pulmonary/Chest: Effort normal and breath sounds normal. No respiratory distress.  Abdominal: Soft. There is no tenderness.    Musculoskeletal: He exhibits edema and tenderness.  Diffuse edema of lower leg on the left.  Up until fossa is nontender.  No swelling of the foot.  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  See attached image for erythema and swelling.  Neurological: He is alert and oriented to person, place, and time. No cranial nerve deficit. He exhibits normal muscle tone. Coordination normal.  Skin: Skin is warm and dry.  Psychiatric: He has a normal mood and affect.  Nursing note and vitals reviewed.            ED Treatments / Results  Labs (all labs ordered are listed, but only abnormal results are displayed) Labs Reviewed  BASIC METABOLIC PANEL - Abnormal; Notable for the following components:      Result Value   Chloride 100 (*)    Glucose, Bld 152 (*)    Calcium 8.8 (*)    All other components within normal limits  CBC WITH DIFFERENTIAL/PLATELET - Abnormal; Notable for the following components:   Monocytes Absolute 1.2 (*)    All other components within normal limits  I-STAT CG4 LACTIC ACID, ED - Abnormal; Notable for the following components:   Lactic Acid, Venous 0.48 (*)    All other components within normal limits  I-STAT CG4 LACTIC ACID, ED    EKG  EKG Interpretation None       Radiology No results found.  Procedures Procedures (including critical care time)  Medications Ordered in ED Medications  cefTRIAXone (ROCEPHIN) 1 g in sodium chloride 0.9 % 100 mL IVPB (0 g Intravenous Stopped 09/17/17 1103)  fluconazole (DIFLUCAN) tablet 200 mg (200 mg Oral Given 09/17/17 0953)     Initial Impression / Assessment and Plan / ED Course  I have reviewed the triage vital signs and the nursing notes.  Pertinent labs & imaging results that were available during my care of the patient were reviewed by me and considered in my medical decision making (see chart for details).      Final Clinical Impressions(s) / ED Diagnoses   Final diagnoses:   Cellulitis of left lower extremity  Tinea pedis of both feet  With erythema and swelling of the lower leg.  He has tinea pedis both feet.  This is likely source of entry for cellulitis.  DVT study negative.  Patient is otherwise clinically well.  He does not have constitutional symptoms.  Diagnostic workup does not suggest sepsis.  Plan will be to initiate outpatient treatment.  Patient is counseled on signs and symptoms where she will return.  He is counseled on managing tinea pedis.  ED Discharge Orders        Ordered    cephALEXin (KEFLEX) 500 MG capsule  2  times daily     09/17/17 1341    fluconazole (DIFLUCAN) 150 MG tablet     09/17/17 1341    sulfamethoxazole-trimethoprim (BACTRIM DS,SEPTRA DS) 800-160 MG tablet  2 times daily     09/17/17 1341    tolnaftate (TINACTIN) 1 % powder  2 times daily     09/17/17 1342       Arby BarrettePfeiffer, Citlalic Norlander, MD 09/18/17 1620

## 2017-09-17 NOTE — Progress Notes (Signed)
Left lower extremity venous duplex has been completed. Negative for DVT. Results were given to Dr. Donnald GarrePfeiffer.  09/17/17 12:05 PM Mark Lawrence Reeanna Acri RVT

## 2017-09-17 NOTE — Discharge Instructions (Signed)
1.  You have severe fungal infection (athlete's foot) between your toes.  It is easier to get a bad infection in your feet and legs when you have chronic fungal infection.  Take fluconazole once a week for the next 4 weeks starting on 3/16 (you were given a first dose in the emergency department). 2.  Clean and dry your feet daily.  You must dry between each toe with a clean cloth.  Use a hair dryer.  Then apply fungal powder and a clean piece of woven gauze between each toe. 3.  Return to the emergency department immediately if you develop a fever, generally feeling unwell, worsening redness or swelling, other concerning symptoms. 4.  You need to make an appointment as soon as possible with a family doctor.

## 2017-09-17 NOTE — ED Triage Notes (Signed)
Pt in c/o left lower leg redness and swelling, redness noted in triage that starts below knee and runs into ankle and foot, denies injury or history of same, denies fevers at home

## 2021-04-15 ENCOUNTER — Ambulatory Visit
Admission: EM | Admit: 2021-04-15 | Discharge: 2021-04-15 | Discharge status: Against Medical Advice | Payer: Self-pay | Attending: Urgent Care | Admitting: Urgent Care

## 2021-04-15 ENCOUNTER — Other Ambulatory Visit: Payer: Self-pay

## 2021-04-15 DIAGNOSIS — I4892 Unspecified atrial flutter: Secondary | ICD-10-CM

## 2021-04-15 DIAGNOSIS — R600 Localized edema: Secondary | ICD-10-CM

## 2021-04-15 DIAGNOSIS — M79604 Pain in right leg: Secondary | ICD-10-CM

## 2021-04-15 DIAGNOSIS — I739 Peripheral vascular disease, unspecified: Secondary | ICD-10-CM

## 2021-04-15 DIAGNOSIS — M79605 Pain in left leg: Secondary | ICD-10-CM

## 2021-04-15 DIAGNOSIS — R609 Edema, unspecified: Secondary | ICD-10-CM
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
## 2021-04-15 NOTE — ED Triage Notes (Signed)
Pt c/o bilat lower extremity pain and edema, intermittent claudication. 8/10 achy pain. Onset approx a year ago but recently has been getting worse faster. Pain gets worse when working or when flexing ankle.

## 2021-04-15 NOTE — ED Notes (Signed)
EMS called per provider request.  

## 2021-04-16 ENCOUNTER — Emergency Department (HOSPITAL_COMMUNITY)
Admission: EM | Admit: 2021-04-16 | Discharge: 2021-04-16 | Disposition: A | Discharge status: Home / Self Care | Payer: Self-pay | Attending: Emergency Medicine | Admitting: Emergency Medicine

## 2021-04-16 ENCOUNTER — Emergency Department (HOSPITAL_COMMUNITY): Payer: Self-pay

## 2021-04-16 ENCOUNTER — Other Ambulatory Visit: Payer: Self-pay

## 2021-04-16 ENCOUNTER — Encounter (HOSPITAL_COMMUNITY): Payer: Self-pay | Admitting: Emergency Medicine

## 2021-04-16 ENCOUNTER — Other Ambulatory Visit (HOSPITAL_COMMUNITY): Payer: Self-pay

## 2021-04-16 DIAGNOSIS — F17210 Nicotine dependence, cigarettes, uncomplicated: Secondary | ICD-10-CM | POA: Insufficient documentation

## 2021-04-16 DIAGNOSIS — Z7901 Long term (current) use of anticoagulants: Secondary | ICD-10-CM | POA: Insufficient documentation

## 2021-04-16 DIAGNOSIS — I10 Essential (primary) hypertension: Secondary | ICD-10-CM | POA: Insufficient documentation

## 2021-04-16 DIAGNOSIS — Z79899 Other long term (current) drug therapy: Secondary | ICD-10-CM | POA: Insufficient documentation

## 2021-04-16 DIAGNOSIS — R600 Localized edema: Secondary | ICD-10-CM | POA: Insufficient documentation

## 2021-04-16 DIAGNOSIS — D649 Anemia, unspecified: Secondary | ICD-10-CM | POA: Insufficient documentation

## 2021-04-16 DIAGNOSIS — Y900 Blood alcohol level of less than 20 mg/100 ml: Secondary | ICD-10-CM | POA: Insufficient documentation

## 2021-04-16 DIAGNOSIS — I483 Typical atrial flutter: Secondary | ICD-10-CM | POA: Insufficient documentation

## 2021-04-16 LAB — CBC
HCT: 30.2 % — ABNORMAL LOW (ref 39.0–52.0)
Hemoglobin: 9.5 g/dL — ABNORMAL LOW (ref 13.0–17.0)
MCH: 28.3 pg (ref 26.0–34.0)
MCHC: 31.5 g/dL (ref 30.0–36.0)
MCV: 89.9 fL (ref 80.0–100.0)
Platelets: 316 10*3/uL (ref 150–400)
RBC: 3.36 MIL/uL — ABNORMAL LOW (ref 4.22–5.81)
RDW: 16.9 % — ABNORMAL HIGH (ref 11.5–15.5)
WBC: 5.8 10*3/uL (ref 4.0–10.5)
nRBC: 0 % (ref 0.0–0.2)

## 2021-04-16 LAB — HEPATIC FUNCTION PANEL
ALT: 16 U/L (ref 0–44)
AST: 50 U/L — ABNORMAL HIGH (ref 15–41)
Albumin: 3 g/dL — ABNORMAL LOW (ref 3.5–5.0)
Alkaline Phosphatase: 224 U/L — ABNORMAL HIGH (ref 38–126)
Bilirubin, Direct: 0.1 mg/dL (ref 0.0–0.2)
Indirect Bilirubin: 0.3 mg/dL (ref 0.3–0.9)
Total Bilirubin: 0.4 mg/dL (ref 0.3–1.2)
Total Protein: 7.3 g/dL (ref 6.5–8.1)

## 2021-04-16 LAB — RETICULOCYTES
Immature Retic Fract: 32.9 % — ABNORMAL HIGH (ref 2.3–15.9)
RBC.: 3.25 MIL/uL — ABNORMAL LOW (ref 4.22–5.81)
Retic Count, Absolute: 75.1 10*3/uL (ref 19.0–186.0)
Retic Ct Pct: 2.3 % (ref 0.4–3.1)

## 2021-04-16 LAB — RAPID URINE DRUG SCREEN, HOSP PERFORMED
Amphetamines: NOT DETECTED
Barbiturates: NOT DETECTED
Benzodiazepines: NOT DETECTED
Cocaine: NOT DETECTED
Opiates: NOT DETECTED
Tetrahydrocannabinol: NOT DETECTED

## 2021-04-16 LAB — FERRITIN: Ferritin: 25 ng/mL (ref 24–336)

## 2021-04-16 LAB — IRON AND TIBC
Iron: 37 ug/dL — ABNORMAL LOW (ref 45–182)
Saturation Ratios: 7 % — ABNORMAL LOW (ref 17.9–39.5)
TIBC: 568 ug/dL — ABNORMAL HIGH (ref 250–450)
UIBC: 531 ug/dL

## 2021-04-16 LAB — BASIC METABOLIC PANEL
Anion gap: 9 (ref 5–15)
BUN: 8 mg/dL (ref 6–20)
CO2: 22 mmol/L (ref 22–32)
Calcium: 9 mg/dL (ref 8.9–10.3)
Chloride: 105 mmol/L (ref 98–111)
Creatinine, Ser: 0.84 mg/dL (ref 0.61–1.24)
GFR, Estimated: >60 mL/min (ref >60)
Glucose, Bld: 114 mg/dL — ABNORMAL HIGH (ref 70–99)
Potassium: 4.1 mmol/L (ref 3.5–5.1)
Sodium: 136 mmol/L (ref 135–145)

## 2021-04-16 LAB — TROPONIN I (HIGH SENSITIVITY)
Troponin I (High Sensitivity): 9 ng/L (ref <18)
Troponin I (High Sensitivity): 9 ng/L (ref <18)

## 2021-04-16 LAB — T4, FREE: Free T4: 0.66 ng/dL (ref 0.61–1.12)

## 2021-04-16 LAB — ECHOCARDIOGRAM COMPLETE
Area-P 1/2: 7.9 cm2
Calc EF: 40.7 %
Height: 71 in
P 1/2 time: 588 msec
S' Lateral: 3.7 cm
Single Plane A2C EF: 48.2 %
Single Plane A4C EF: 31.9 %
Weight: 3040.58 oz

## 2021-04-16 LAB — POC OCCULT BLOOD, ED: Fecal Occult Bld: NEGATIVE

## 2021-04-16 LAB — D-DIMER, QUANTITATIVE: D-Dimer, Quant: 1.7 ug/mL-FEU — ABNORMAL HIGH (ref 0.00–0.50)

## 2021-04-16 LAB — BRAIN NATRIURETIC PEPTIDE: B Natriuretic Peptide: 185.6 pg/mL — ABNORMAL HIGH (ref 0.0–100.0)

## 2021-04-16 LAB — PROTIME-INR
INR: 1.1 (ref 0.8–1.2)
Prothrombin Time: 14 seconds (ref 11.4–15.2)

## 2021-04-16 LAB — FOLATE: Folate: 8.9 ng/mL (ref >5.9)

## 2021-04-16 LAB — TSH: TSH: 10.441 u[IU]/mL — ABNORMAL HIGH (ref 0.350–4.500)

## 2021-04-16 LAB — ETHANOL: Alcohol, Ethyl (B): <10 mg/dL (ref <10)

## 2021-04-16 LAB — VITAMIN B12: Vitamin B-12: 408 pg/mL (ref 180–914)

## 2021-04-16 MED ORDER — METOPROLOL TARTRATE 5 MG/5ML IV SOLN
5.0000 mg | Freq: Once | INTRAVENOUS | Status: AC
  Administered 2021-04-16: 5 mg via INTRAVENOUS
  Filled 2021-04-16: qty 5

## 2021-04-16 MED ORDER — APIXABAN 5 MG PO TABS
5.0000 mg | ORAL_TABLET | Freq: Two times a day (BID) | ORAL | Status: DC
  Administered 2021-04-16: 5 mg via ORAL
  Filled 2021-04-16: qty 1

## 2021-04-16 MED ORDER — AMIODARONE HCL 200 MG PO TABS
200.0000 mg | ORAL_TABLET | Freq: Two times a day (BID) | ORAL | 0 refills | Status: DC
  Filled 2021-04-16: qty 14, 7d supply, fill #0

## 2021-04-16 MED ORDER — METOPROLOL TARTRATE 50 MG PO TABS
50.0000 mg | ORAL_TABLET | Freq: Two times a day (BID) | ORAL | 0 refills | Status: DC
  Filled 2021-04-16: qty 60, 30d supply, fill #0

## 2021-04-16 MED ORDER — APIXABAN 5 MG PO TABS
5.0000 mg | ORAL_TABLET | Freq: Two times a day (BID) | ORAL | 0 refills | Status: AC
  Filled 2021-04-16: qty 60, 30d supply, fill #0

## 2021-04-16 MED ORDER — PERFLUTREN LIPID MICROSPHERE
1.0000 mL | INTRAVENOUS | Status: AC | PRN
  Administered 2021-04-16: 1 mL via INTRAVENOUS
  Filled 2021-04-16: qty 10

## 2021-04-16 MED ORDER — IOHEXOL 350 MG/ML SOLN
50.0000 mL | Freq: Once | INTRAVENOUS | Status: AC | PRN
  Administered 2021-04-16: 50 mL via INTRAVENOUS

## 2021-04-16 NOTE — Discharge Planning (Addendum)
Frazier Balfour J. Lucretia Roers, RN, BSN, [place] [phone]  RNCM set up appointment with Ruben Im on 04/30/21 @ 0915.  Appointment date and time placed on AVS and advised to please arrive 15 min early and take a picture ID and your current medications.

## 2021-04-16 NOTE — ED Triage Notes (Signed)
Pt in with c/o irregular heart beat and bilateral leg swelling. States he went to [facility] yesterday, they wanted him to go to ED. EMS arrived for transfer, but he couldn't go to ED at the time. They performed an EKG which showed Aflutter in 130's. Pt denies any current cp, n/v or sob. Does have significant BLE swelling present.

## 2021-04-16 NOTE — Discharge Planning (Signed)
RNCM consulted regarding medication assistance of pt unable to afford medications.  RNCM utilized [facility] petty cash to cover $8.00 co-pay and filled through [facility].  Rx e-scribed to [facility] and delivered to pt prior to discharge home.  No further RNCM needs identified at this time.  ° °

## 2021-04-16 NOTE — Progress Notes (Signed)
2D echocardiogram complete 

## 2021-04-16 NOTE — Progress Notes (Signed)
ANTICOAGULATION CONSULT NOTE - Initial Consult  Pharmacy Consult for apixaban Indication: atrial fibrillation  No Known Allergies  Patient Measurements: Height: 5\' 11"  (180.3 cm) Weight: 86.2 kg (190 lb 0.6 oz) IBW/kg (Calculated) : 75.3  Vital Signs: Temp: 98.5 F (36.9 C) (10/06 0653) Temp Source: Oral (10/06 0653) BP: 149/100 (10/06 0830) Pulse Rate: 125 (10/06 0830)  Labs: Recent Labs    04/16/21 0655  HGB 9.5*  HCT 30.2*  PLT 316  CREATININE 0.84  TROPONINIHS 9    Estimated Creatinine Clearance: 108.3 mL/min (by C-G formula based on SCr of 0.84 mg/dL).   Medical History: Past Medical History:  Diagnosis Date   Hypertension     Medications: see MAR  Assessment:  55 yo M with Afib - new apixaban start. Pharmacy consulted to dose apixaban. CBC wnl, Cr at bsl 0.84mg /dL.  No AC PTA.  Goal of Therapy:  Monitor platelets by anticoagulation protocol: Yes   Plan: start Apixaban 5mg  PO BID Monitor daily CBC  40, PharmD, [facility] Clinical Pharmacist ED RPh Phone: [phone] Main RX: [phone]

## 2021-04-16 NOTE — Progress Notes (Signed)
ON-CALL CARDIOLOGY 04/16/21  Patient's name: Mark Lawrence.   MRN: [id number].    DOB: [birth year] Primary care provider: Patient, No Pcp Per (Inactive). Primary cardiologist: N/A  Interaction regarding this patient's care today: Cardiology on-call was paged by ER physician Dr.Schlossman to discuss the patient's presentation and EKG.  Upon arrival EKG was performed which notes atrial flutter.  He has been given AV nodal blocking agents parenterally which has improved his ventricular rate but currently not at goal.  I recommended that he be hospitalized for rate/rhythm control management given his atrial flutter.  However, it appears that the patient has his daughter's wedding tomorrow and would like to be discharged home later today.  Since duration of atrial flutter is unknown recommended metoprolol 50 mg p.o. twice daily with holding parameters given the low normal LVEF on echocardiogram and amiodarone 200 mg p.o. twice daily.  And oral anticoagulation will be discussed with the patient by the ED provider.   Long-term oral anticoagulation decision will be made outpatient once rhythm is restored.  Avoid medications that may prolong his QT. If on Fluconazole consider discontinuing it if safe.   Educated on importance of complete alcohol cessation.  Follow-up visit is scheduled for 04/21/2021.  In the interim, if he has continued symptoms of palpitation, new onset of chest pain or shortness of breath he is asked to go to the closest ER via EMS for further evaluation and management.  No charge.  Tessa Lerner, [place], [facility]  Pager: [phone] Office: [phone]

## 2021-04-16 NOTE — Discharge Instructions (Addendum)
Congrats to your daughter! If you develop any symptoms including chest pain, shortness of breath, dizziness, please return to the ED for further care. You have been scheduled to see a PCP and Cardiologist as listed above.

## 2021-04-16 NOTE — ED Provider Notes (Signed)
[facility] EMERGENCY DEPARTMENT Provider Note   CSN: [id number] Arrival date & time: 04/16/21  6387     History Chief Complaint  Patient presents with   Irregular Heart Beat   Leg Swelling    Ravin Denardo is a 55 y.o. male.  HPI     55 year old male with history of hypertension, who does not regularly see a physician, history of alcohol use and smoking, presents with concern for bilateral leg swelling and rapid heart rate found at urgent care.  Patient presented to urgent care with concern for bilateral lower extremity swelling and pain.  Reports that it has been waxing and waning over time, however has been more consistent and worse over the last month.  He denies any shortness of breath, chest pain today, nausea, vomiting, diarrhea, black or bloody stools, lightheadedness.  Reports he had a brief episode of chest pain that went on and off for 1 minute last week and improved.  Both of his legs are swollen and sore.  No fevers or cough.  He reports he drinks a 40 ounce every day and smokes cigarettes. Denies other drug use.  His daughter is getting married tomorrow.   Past Medical History:  Diagnosis Date   Hypertension     Patient Active Problem List   Diagnosis Date Noted   Typical atrial flutter [facility])     Past Surgical History:  Procedure Laterality Date   LEG SURGERY Right        No family history on file.  Social History   Tobacco Use   Smoking status: Every Day    Packs/day: 0.50    Types: Cigarettes   Smokeless tobacco: Never  Substance Use Topics   Alcohol use: Yes    Alcohol/week: 14.0 standard drinks    Types: 14 Cans of beer per week   Drug use: Yes    Types: Marijuana    Home Medications Prior to Admission medications   Medication Sig Start Date End Date Taking? Authorizing Provider  amiodarone (PACERONE) 200 MG tablet Take 1 tablet (200 mg total) by mouth 2 (two) times daily for 7 days. 04/16/21 04/23/21 Yes Alvira Monday, MD  apixaban (ELIQUIS) 5 MG TABS tablet Take 1 tablet (5 mg total) by mouth 2 (two) times daily. 04/16/21 05/16/21 Yes Alvira Monday, MD  metoprolol tartrate (LOPRESSOR) 50 MG tablet Take 1 tablet (50 mg total) by mouth 2 (two) times daily. Do not take if your heart rate is less than 60 beats per minute or if the top number of your blood pressure is less than 100. 04/16/21 05/16/21 Yes Cena Bruhn, Denny Peon, MD  vitamin C (ASCORBIC ACID) 500 MG tablet Take 500 mg by mouth daily.   Yes [provider]  cephALEXin (KEFLEX) 500 MG capsule Take 1 capsule (500 mg total) by mouth 4 (four) times daily. Patient not taking: Reported on 04/16/2021 12/09/15   Bethel Born, PA-C  cephALEXin (KEFLEX) 500 MG capsule Take 2 capsules (1,000 mg total) by mouth 2 (two) times daily. Patient not taking: Reported on 04/16/2021 09/17/17   Arby Barrette, MD  salicylic acid 6 % gel Apply topically daily. Patient not taking: Reported on 04/16/2021 12/13/15   Margarita Grizzle, MD  sulfamethoxazole-trimethoprim (BACTRIM DS,SEPTRA DS) 800-160 MG tablet Take 1 tablet by mouth 2 (two) times daily. Patient not taking: Reported on 04/16/2021 12/22/15   Teressa Lower, NP  terbinafine (LAMISIL AT) 1 % cream Apply topically 2 (two) times daily. Apply to feet and around  fingernails only Patient not taking: Reported on 04/16/2021 12/09/15   Bethel Born, PA-C  terbinafine (LAMISIL) 250 MG tablet Take 1 tablet (250 mg total) by mouth daily. Patient not taking: Reported on 04/16/2021 12/13/15   Margarita Grizzle, MD  terbinafine (LAMISIL) 250 MG tablet Take 1 tablet (250 mg total) by mouth daily. Patient not taking: Reported on 04/16/2021 12/22/15   Teressa Lower, NP  tolnaftate (TINACTIN) 1 % powder Apply 1 application topically 2 (two) times daily. Patient not taking: Reported on 04/16/2021 09/17/17   Arby Barrette, MD  triamcinolone cream (KENALOG) 0.1 % Apply 1 application topically 2 (two) times daily. Apply to hands and  arms only Patient not taking: Reported on 04/16/2021 12/09/15   Bethel Born, PA-C    Allergies    Patient has no known allergies.  Review of Systems   Review of Systems  Constitutional:  Negative for fever.  HENT:  Negative for sore throat.   Eyes:  Negative for visual disturbance.  Respiratory:  Negative for shortness of breath.   Cardiovascular:  Positive for leg swelling. Negative for chest pain.  Gastrointestinal:  Negative for abdominal pain, blood in stool, diarrhea, nausea and vomiting.  Genitourinary:  Negative for difficulty urinating.  Musculoskeletal:  Negative for back pain and neck stiffness.  Skin:  Negative for rash.  Neurological:  Negative for syncope, light-headedness and headaches.   Physical Exam Updated Vital Signs BP (!) 139/97   Pulse (!) 126   Temp 98.5 F (36.9 C) (Oral)   Resp (!) 22   Ht 5\' 11"  (1.803 m)   Wt 86.2 kg   SpO2 98%   BMI 26.50 kg/m   Physical Exam Vitals and nursing note reviewed.  Constitutional:      General: He is not in acute distress.    Appearance: He is well-developed. He is not diaphoretic.  HENT:     Head: Normocephalic and atraumatic.  Eyes:     Conjunctiva/sclera: Conjunctivae normal.  Cardiovascular:     Rate and Rhythm: Regular rhythm. Tachycardia present.     Heart sounds: Normal heart sounds. No murmur heard.   No friction rub. No gallop.  Pulmonary:     Effort: Pulmonary effort is normal. No respiratory distress.     Breath sounds: Normal breath sounds. No wheezing or rales.  Abdominal:     General: There is no distension.     Palpations: Abdomen is soft.     Tenderness: There is no abdominal tenderness. There is no guarding.  Musculoskeletal:        General: Swelling (bilateral edema) present.     Cervical back: Normal range of motion.  Skin:    General: Skin is warm and dry.  Neurological:     Mental Status: He is alert and oriented to person, place, and time.    ED Results / Procedures /  Treatments   Labs (all labs ordered are listed, but only abnormal results are displayed) Labs Reviewed  BASIC METABOLIC PANEL - Abnormal; Notable for the following components:      Result Value   Glucose, Bld 114 (*)    All other components within normal limits  CBC - Abnormal; Notable for the following components:   RBC 3.36 (*)    Hemoglobin 9.5 (*)    HCT 30.2 (*)    RDW 16.9 (*)    All other components within normal limits  BRAIN NATRIURETIC PEPTIDE - Abnormal; Notable for the following components:   B Natriuretic Peptide  185.6 (*)    All other components within normal limits  HEPATIC FUNCTION PANEL - Abnormal; Notable for the following components:   Albumin 3.0 (*)    AST 50 (*)    Alkaline Phosphatase 224 (*)    All other components within normal limits  TSH - Abnormal; Notable for the following components:   TSH 10.441 (*)    All other components within normal limits  IRON AND TIBC - Abnormal; Notable for the following components:   Iron 37 (*)    TIBC 568 (*)    Saturation Ratios 7 (*)    All other components within normal limits  RETICULOCYTES - Abnormal; Notable for the following components:   RBC. 3.25 (*)    Immature Retic Fract 32.9 (*)    All other components within normal limits  D-DIMER, QUANTITATIVE (NOT AT [facility]) - Abnormal; Notable for the following components:   D-Dimer, Quant 1.70 (*)    All other components within normal limits  PROTIME-INR  T4, FREE  ETHANOL  RAPID URINE DRUG SCREEN, HOSP PERFORMED  VITAMIN B12  FERRITIN  FOLATE  POC OCCULT BLOOD, ED  TROPONIN I (HIGH SENSITIVITY)  TROPONIN I (HIGH SENSITIVITY)    EKG EKG Interpretation  Date/Time:  Thursday April 16 2021 06:43:37 EDT Ventricular Rate:  130 PR Interval:    QRS Duration: 82 QT Interval:  382 QTC Calculation: 562 R Axis:   61 Text Interpretation: Atrial flutter with 2:1 A-V conduction ST & T wave abnormality, consider anterolateral ischemia Abnormal ECG No significant  change since last tracing Confirmed by Alvira Monday ([id number]) on 04/16/2021 7:35:00 AM  Radiology DG Chest 2 View  Result Date: 04/16/2021 CLINICAL DATA:  55 year old male with history of chest pain. Leg swelling. Irregular heartbeat. EXAM: CHEST - 2 VIEW COMPARISON:  Chest x-ray 01/18/2015. FINDINGS: Lung volumes are normal. Linear scarring in the region of the lingula. No pleural effusions. No pneumothorax. No pulmonary nodule or mass noted. Pulmonary vasculature and the cardiomediastinal silhouette are within normal limits. IMPRESSION: 1. No radiographic evidence of acute cardiopulmonary disease. 2. Linear scarring in the region of the lingula. Electronically Signed   By: Trudie Reed M.D.   On: 04/16/2021 07:55   CT Angio Chest PE W and/or Wo Contrast  Result Date: 04/16/2021 CLINICAL DATA:  PE suspected, high prob EXAM: CT ANGIOGRAPHY CHEST WITH CONTRAST TECHNIQUE: Multidetector CT imaging of the chest was performed using the standard protocol during bolus administration of intravenous contrast. Multiplanar CT image reconstructions and MIPs were obtained to evaluate the vascular anatomy. CONTRAST:  51mL OMNIPAQUE IOHEXOL 350 MG/ML SOLN COMPARISON:  Same day radiograph FINDINGS: Cardiovascular: Borderline enlarged heart.No pericardial disease.Normal size main and branch pulmonary arteries.Moderate atherosclerotic calcifications of the thoracic aorta. Mild coronary artery calcifications. Mediastinum/Nodes: No lymphadenopathy.The thyroid is unremarkable.Esophagus is unremarkable.The trachea is unremarkable. Lungs/Pleura: Bibasilar hypoventilatory changes. Bibasilar scarring/linear atelectasis. Diffuse mild bronchial wall thickening. Linear atelectasis/scarring in the lingula.No suspicious pulmonary nodules or masses.No pleural effusion.No pneumothorax. Upper Abdomen: No acute abnormality. Musculoskeletal: No acute osseous abnormality.No suspicious lytic or blastic lesions. There is a subcutaneous  cystic lesion along the right shoulder, likely sebaceous cyst measuring 1.2 cm (series 6, image 21). Review of the MIP images confirms the above findings. IMPRESSION: No pulmonary embolism or other acute findings in the chest. Electronically Signed   By: Caprice Renshaw M.D.   On: 04/16/2021 12:58   ECHOCARDIOGRAM COMPLETE  Result Date: 04/16/2021    ECHOCARDIOGRAM REPORT   Patient Name:   ETHAN MCKINNEY  Date of Exam: 04/16/2021 Medical Rec #:  [id number]        Height:       71.0 in Accession #:    [id number]       Weight:       190.0 lb Date of Birth:  1966-03-13        BSA:          2.063 m Patient Age:    55 years         BP:           122/82 mmHg Patient Gender: M                HR:           125 bpm. Exam Location:  Inpatient Procedure: 2D Echo, Color Doppler, Cardiac Doppler and Intracardiac            Opacification Agent Indications:    A flutter  History:        Patient has no prior history of Echocardiogram examinations.                 Signs/Symptoms:Edema.  Sonographer:    Melissa Morford RDCS (AE, PE) Referring Phys: [id number] Deaundra Dupriest IMPRESSIONS  1. Left ventricular ejection fraction, by estimation, is 50 to 55%. The left ventricle has low normal function. The left ventricle has no regional wall motion abnormalities. Left ventricular diastolic function is not assessed due to atrial flutter.  2. Right ventricular systolic function is low normal. The right ventricular size is normal. There is mildly elevated pulmonary artery systolic pressure. The estimated right ventricular systolic pressure is 46.4 mmHg.  3. The mitral valve is normal in structure. Moderate mitral valve regurgitation.  4. The aortic valve is tricuspid. Aortic valve regurgitation is trivial. Mild aortic valve sclerosis is present, with no evidence of aortic valve stenosis.  5. The inferior vena cava is dilated in size with <50% respiratory variability, suggesting right atrial pressure of 15 mmHg. Comparison(s): No prior  Echocardiogram. Conclusion(s)/Recommendation(s): No left ventricular mural or apical thrombus/thrombi. FINDINGS  Left Ventricle: Left ventricular ejection fraction, by estimation, is 50 to 55%. The left ventricle has low normal function. The left ventricle has no regional wall motion abnormalities. Definity contrast agent was given IV to delineate the left ventricular endocardial borders. The left ventricular internal cavity size was normal in size. There is no left ventricular hypertrophy. Left ventricular diastolic function is not assessed due to atrial flutter. Right Ventricle: The right ventricular size is normal. No increase in right ventricular wall thickness. Right ventricular systolic function is low normal. There is mildly elevated pulmonary artery systolic pressure. The tricuspid regurgitant velocity is 2.80 m/s, and with an assumed right atrial pressure of 15 mmHg, the estimated right ventricular systolic pressure is 46.4 mmHg. Left Atrium: Left atrial size was normal in size. Right Atrium: Right atrial size was normal in size. Pericardium: There is no evidence of pericardial effusion. Mitral Valve: The mitral valve is normal in structure. Moderate mitral valve regurgitation. Tricuspid Valve: The tricuspid valve is normal in structure. Tricuspid valve regurgitation is mild . No evidence of tricuspid stenosis. Aortic Valve: The aortic valve is tricuspid. Aortic valve regurgitation is trivial. Aortic regurgitation PHT measures 588 msec. Mild aortic valve sclerosis is present, with no evidence of aortic valve stenosis. Pulmonic Valve: The pulmonic valve was normal in structure. Pulmonic valve regurgitation is trivial. No evidence of pulmonic stenosis. Aorta: The aortic root is normal in size and structure. Venous:  The inferior vena cava is dilated in size with less than 50% respiratory variability, suggesting right atrial pressure of 15 mmHg. IAS/Shunts: The atrial septum is grossly normal. EKG: Rhythm strip  during this exam demostrated atrial flutter.  LEFT VENTRICLE PLAX 2D LVIDd:         4.83 cm      Diastology LVIDs:         3.70 cm      LV e' medial:   9.79 cm/s LV PW:         1.00 cm      LV E/e' medial: 11.0 LV IVS:        0.97 cm  LV Volumes (MOD) LV vol d, MOD A2C: 103.0 ml LV vol d, MOD A4C: 140.0 ml LV vol s, MOD A2C: 53.4 ml LV vol s, MOD A4C: 95.4 ml LV SV MOD A2C:     49.6 ml LV SV MOD A4C:     140.0 ml LV SV MOD BP:      49.5 ml RIGHT VENTRICLE TAPSE (M-mode): 1.0 cm LEFT ATRIUM             Index        RIGHT ATRIUM           Index LA diam:        4.60 cm 2.23 cm/m   RA Area:     15.40 cm LA Vol (A2C):   63.7 ml 30.87 ml/m  RA Volume:   40.50 ml  19.63 ml/m LA Vol (A4C):   62.0 ml 30.05 ml/m LA Biplane Vol: 65.7 ml 31.84 ml/m  AORTIC VALVE LVOT Vmax:   84.10 cm/s LVOT Vmean:  58.250 cm/s LVOT VTI:    0.128 m AI PHT:      588 msec  AORTA Ao Root diam: 2.85 cm MITRAL VALVE                TRICUSPID VALVE MV Area (PHT): 7.90 cm     TR Peak grad:   31.4 mmHg MV Decel Time: 96 msec      TR Vmax:        280.00 cm/s MV E velocity: 108.00 cm/s                             SHUNTS                             Systemic VTI: 0.13 m Sunit Tolia DO Electronically signed by Tessa Lerner DO Signature Date/Time: 04/16/2021/11:55:28 AM    Final     Procedures Procedures   Medications Ordered in ED Medications  perflutren lipid microspheres (DEFINITY) IV suspension (1 mL Intravenous Given 04/16/21 1101)  metoprolol tartrate (LOPRESSOR) injection 5 mg (5 mg Intravenous Given 04/16/21 0841)  metoprolol tartrate (LOPRESSOR) injection 5 mg (5 mg Intravenous Given 04/16/21 1117)  iohexol (OMNIPAQUE) 350 MG/ML injection 50 mL (50 mLs Intravenous Contrast Given 04/16/21 1213)    ED Course  I have reviewed the triage vital signs and the nursing notes.  Pertinent labs & imaging results that were available during my care of the patient were reviewed by me and considered in my medical decision making (see chart for  details).    MDM Rules/Calculators/A&P  55 year old male with history of hypertension, who does not regularly see a physician, history of alcohol use and smoking, presents with concern for bilateral leg swelling and rapid heart rate found at urgent care.  Found to be in atrial flutter with a rate in the 130s.  He does not have shortness of breath, no hypoxia no asymmetric leg pain or swelling.  Found to have anemia to 9.6, down from 14 in 2019. Denies black or bloody stools, no melena on exam and hemoccult negative. Do not feel this is a contraindication to eliquis use.  Added an anemia panel, will have him recheck as an outpatient. Recommend iron. TSH elevated by free T4 normal.  Consulted TOC and appointment being secured with internal medicine.    For his atrial flutter, discussed with on call Cardiology, Dr. Odis Hollingshead.  Given metoprolol IV 5mg , with little improvement, ordered additoinal 5mg , ordered ddimer.  Will check ECHO/TSH/etoh/UDS.   DDimer positive and CT PE study ordered and negativefor PE.  Troponin negative x2.  ECHO with 50-55% EF.    Discussed risks and benefits of anticoagulation in detail, particularly in the setting of etoh use and he states understanding.  HR 110s-120.  Dicussed admission for further rate control and evaluation, however he would like to be discharged as his daughter's wedding is tomorrow.  He remains asymptomatic, normal blood pressures, no dyspnea or chest pain, no dizziness.  Discussed with Dr. .  Mr. Fabiano declines other ED treatment. Will treatwith amiodarone 200mg  BID for 1 week to load amiodarone, eliquis, metoprolol with hold parameters with close follow up on Tuesday.  Discussed reasons to return in detail.        Final Clinical Impression(s) / ED Diagnoses Final diagnoses:  Typical atrial flutter (HCC)  Anemia, unspecified type    Rx / DC Orders ED Discharge Orders          Ordered    apixaban (ELIQUIS)  5 MG TABS tablet  2 times daily        04/16/21 1403    metoprolol tartrate (LOPRESSOR) 50 MG tablet  2 times daily        04/16/21 1403    amiodarone (PACERONE) 200 MG tablet  2 times daily        04/16/21 1403             06/16/21, MD 04/16/21 2133

## 2021-04-21 ENCOUNTER — Ambulatory Visit: Payer: Medicaid Other | Admitting: Cardiology

## 2021-04-30 ENCOUNTER — Other Ambulatory Visit: Payer: Self-pay

## 2021-04-30 ENCOUNTER — Ambulatory Visit (HOSPITAL_COMMUNITY)
Admission: RE | Admit: 2021-04-30 | Discharge: 2021-04-30 | Disposition: A | Discharge status: Home / Self Care | Payer: Medicaid Other | Source: Ambulatory Visit | Attending: Internal Medicine | Admitting: Internal Medicine

## 2021-04-30 ENCOUNTER — Encounter: Payer: Self-pay | Admitting: Internal Medicine

## 2021-04-30 ENCOUNTER — Ambulatory Visit (INDEPENDENT_AMBULATORY_CARE_PROVIDER_SITE_OTHER): Payer: Self-pay | Admitting: Internal Medicine

## 2021-04-30 VITALS — BP 124/82 | HR 64 | Temp 98.4°F | Resp 32 | Ht 71.0 in | Wt 158.6 lb

## 2021-04-30 DIAGNOSIS — I483 Typical atrial flutter: Secondary | ICD-10-CM

## 2021-04-30 DIAGNOSIS — R600 Localized edema: Secondary | ICD-10-CM

## 2021-04-30 DIAGNOSIS — D509 Iron deficiency anemia, unspecified: Secondary | ICD-10-CM | POA: Insufficient documentation

## 2021-04-30 DIAGNOSIS — Z23 Encounter for immunization: Secondary | ICD-10-CM

## 2021-04-30 MED ORDER — FERROUS SULFATE 325 (65 FE) MG PO TABS
325.0000 mg | ORAL_TABLET | Freq: Every day | ORAL | 0 refills | Status: DC
  Filled 2021-04-30: qty 30, 30d supply, fill #0

## 2021-04-30 MED ORDER — FUROSEMIDE 40 MG PO TABS
40.0000 mg | ORAL_TABLET | Freq: Every day | ORAL | 0 refills | Status: DC
  Filled 2021-04-30: qty 30, 30d supply, fill #0

## 2021-04-30 NOTE — Assessment & Plan Note (Addendum)
Patient presents for ED follow-up visit.  Patient was seen in the ED on 04/16/2021 complaining of intermittent chest pain, palpitations, and lower extremity edema.  Patient was diagnosed with atrial flutter evidenced by atrial flutter seen on EKG.  ED labs significant for iron deficiency anemia.  Trops neg x2; echo revealed EF 50 to 55% without concerning valvulopathy; chest x-ray: no cardiopulmonary disease; CT angio of the chest unremarkable.  Patient was discharged from the ED with apixaban 5 mg; metoprolol 50 mg; amiodarone 200 mg.  Patient has been compliant with these medications since discharge. Patient had scheduled appt with cardiology on 04/21/21; however, patient culd not make it to this appt.  Today, patient reports minimal chest pain on exertion lasting for a few seconds relieved with rest; denies palpitations, fatigue, dizziness, lightheadedness, headache, changes in vision, or shortness of breath. Patient notes LLE edema,however, states swelling has improved from previous visit. On physical exam, regular rate and irregular rhythm noted on cardiac auscultation; clear lungs bilaterally; 2+ pitting edema of lower extremities bilaterally.  EKG repeat in office reveals a flutter unchanged from EKG obtained in the ED.  PLAN:  Continue apixaban 5 mg; metoprolol 25 mg; amiodarone 200 mg as prescribed Follow up in 1 week Follow up cardiology 05/08/21 with Dr. Odis Hollingshead

## 2021-04-30 NOTE — Progress Notes (Signed)
   CC: ED follow up; LLE  HPI:  [name] is a 55 y.o. male with a past medical history stated below and presents today for cc listed above. Please see problem based assessment and plan for additional details.  Past Medical History:  Diagnosis Date   Hypertension     Current Outpatient Medications on File Prior to Visit  Medication Sig Dispense Refill   amiodarone (PACERONE) 200 MG tablet Take 1 tablet (200 mg total) by mouth 2 (two) times daily for 7 days. 14 tablet 0   apixaban (ELIQUIS) 5 MG TABS tablet Take 1 tablet (5 mg total) by mouth 2 (two) times daily. 60 tablet 0   metoprolol tartrate (LOPRESSOR) 50 MG tablet Take 1 tablet (50 mg total) by mouth 2 (two) times daily. Do not take if your heart rate is less than 60 beats per minute or if the top number of your blood pressure is less than 100. 60 tablet 0   vitamin C (ASCORBIC ACID) 500 MG tablet Take 500 mg by mouth daily.     No current facility-administered medications on file prior to visit.    No family history on file.  Social History   Socioeconomic History   Marital status: Legally Separated    Spouse name: Not on file   Number of children: Not on file   Years of education: Not on file   Highest education level: Not on file  Occupational History   Not on file  Tobacco Use   Smoking status: Every Day    Packs/day: 0.50    Types: Cigarettes   Smokeless tobacco: Never  Substance and Sexual Activity   Alcohol use: Yes    Alcohol/week: 14.0 standard drinks    Types: 14 Cans of beer per week   Drug use: Yes    Types: Marijuana   Sexual activity: Not on file  Other Topics Concern   Not on file  Social History Narrative   Not on file   Social Determinants of Health   Financial Resource Strain: Not on file  Food Insecurity: Not on file  Transportation Needs: Not on file  Physical Activity: Not on file  Stress: Not on file  Social Connections: Not on file  Intimate Partner Violence: Not on  file    Review of Systems: ROS negative except for what is noted on the assessment and plan.  Vitals:   04/30/21 0933  BP: 124/82  Pulse: 64  Resp: (!) 32  Temp: 98.4 F (36.9 C)  TempSrc: Oral  SpO2: 100%  Weight: 158 lb 9.6 oz (71.9 kg)  Height: 5\' 11"  (1.803 m)     Physical Exam: Constitutional: well-appearing man sitting in the chair, in no acute distress HENT: normocephalic atraumatic, mucous membranes moist Eyes: conjunctiva non-erythematous Neck: supple Cardiovascular: regular rate and irregular rhythm, no m/r/g; 2+ lower extremity edema bilaterally Pulmonary/Chest: normal work of breathing on room air, lungs clear to auscultation bilaterally Abdominal: soft, non-tender, non-distended MSK: normal bulk and tone Neurological: alert & oriented x 3, 5/5 strength in bilateral upper and lower extremities, normal gait Skin: warm and dry Psych: normal mood, normal behavior   Assessment & Plan:   See Encounters Tab for problem based charting.  Patient seen with Dr. , M.D. [facility], PGY-1 Pager: [phone], Phone: [phone] Date 04/30/2021 Time 11:19 AM

## 2021-04-30 NOTE — Assessment & Plan Note (Signed)
Patient was seen in the ED on 04/16/2021.  Labs obtained in the ED revealed iron deficiency anemia.  Hemoglobin 9.6 at that time with low iron saturation; ferritin within normal limits.  Folate and B12 within normal limits.  Patient denies fatigue, lightheadedness, dizziness, changes in vision, syncope, or pagophagia.  Patient reports dark stool, denies frank blood in stool; denies nausea, vomiting, dysphagia, dyspnea, excessive use of aspirin or NSAIDs.   PLAN: Repeat CBC Started on ferrous sulfate 325mg  daily; if constipation develops, take every other day Patient is uninsured, when patient obtains orange card; referral to GI for diagnostic colonoscopy

## 2021-04-30 NOTE — Patient Instructions (Addendum)
I prescribed you an iron supplement because your iron is low.  Take this medication once daily.  A common side effect is constipation.  If you start having constipation you can take this iron pill every other day.  I also prescribed to Lasix which is a water pill to help decrease the swelling on the right leg.  Take this once a day for 7 days.   2.  I will call the cardiology office to reschedule an appointment so that they can do further testing if necessary.  3.  We did an EKG today to assess the rhythm of your heart to determine if it is atrial flutter or atrial fibrillation.  Which means an irregular heart rhythm.  The current medications you are on is helping control that.  4.  We will going to repeat blood work to see where your blood levels are, during your ED visit it was low indicating iron deficiency anemia.  Since you are on Eliquis a blood thinner I want to check your blood level has decreased further.  5.  Once you receive your orange card please call the office to let them know and then I can schedule you to a GI doctor to determine why your blood levels are low.  6.  I will schedule a follow-up visit in 1 week to reassess everything we discussed.

## 2021-04-30 NOTE — Assessment & Plan Note (Addendum)
Patient has been experiencing lower extremity edema since April 16, 2021.  He was seen in the ED for similar complaint.  Patient was not diuresed in the ED.  Since discharge, patient has persistent lower extremity edema.  On physical exam today, 2+ pitting edema bilaterally.  Patient denies associated shortness of breath, muscle cramping or leg pain.   PLAN: Started Lasix 40 mg daily Follow-up in 1 week

## 2021-05-01 LAB — CBC WITH DIFFERENTIAL/PLATELET
Basophils Absolute: 0.1 10*3/uL (ref 0.0–0.2)
Basos: 1 %
EOS (ABSOLUTE): 0.2 10*3/uL (ref 0.0–0.4)
Eos: 2 %
Hematocrit: 29.8 % — ABNORMAL LOW (ref 37.5–51.0)
Hemoglobin: 9.6 g/dL — ABNORMAL LOW (ref 13.0–17.7)
Immature Grans (Abs): 0 10*3/uL (ref 0.0–0.1)
Immature Granulocytes: 0 %
Lymphocytes Absolute: 1.7 10*3/uL (ref 0.7–3.1)
Lymphs: 19 %
MCH: 27.8 pg (ref 26.6–33.0)
MCHC: 32.2 g/dL (ref 31.5–35.7)
MCV: 86 fL (ref 79–97)
Monocytes Absolute: 1 10*3/uL — ABNORMAL HIGH (ref 0.1–0.9)
Monocytes: 11 %
Neutrophils Absolute: 6.3 10*3/uL (ref 1.4–7.0)
Neutrophils: 67 %
Platelets: 196 10*3/uL (ref 150–450)
RBC: 3.45 x10E6/uL — ABNORMAL LOW (ref 4.14–5.80)
RDW: 15.9 % — ABNORMAL HIGH (ref 11.6–15.4)
WBC: 9.3 10*3/uL (ref 3.4–10.8)

## 2021-05-04 ENCOUNTER — Other Ambulatory Visit: Payer: Self-pay

## 2021-05-04 NOTE — Progress Notes (Signed)
Internal Medicine Clinic Attending  I saw and evaluated the patient.  I personally confirmed the key portions of the history and exam documented by Dr. Ruben Im and I reviewed pertinent patient test results.  The assessment, diagnosis, and plan were formulated together and I agree with the documentation in the resident's note.   Patient with 1-2+ pitting LE edema bilaterally. Concern for development of HFpEF given his persistent atrial flutter. We have started lasix 40 mg daily and asked him to schedule follow up with his cardiologist.

## 2021-05-07 ENCOUNTER — Encounter: Payer: Medicaid Other | Admitting: Internal Medicine

## 2021-05-08 ENCOUNTER — Ambulatory Visit: Payer: Medicaid Other | Admitting: Cardiology

## 2021-08-25 ENCOUNTER — Ambulatory Visit: Payer: Self-pay

## 2021-08-25 NOTE — Telephone Encounter (Signed)
° °  Chief Complaint: Abdominal pain Symptoms: Black stool Frequency: Started 1 week ago Pertinent Negatives: Patient denies diarrhea Disposition: [x] ED /[] Urgent Care (no appt availability in office) / [] Appointment(In office/virtual)/ []  [facility]/ [] Home Care/ [] Refused Recommended Disposition /[] [facility]/ []  Follow-up with PCP Additional Notes:    Reason for Disposition  Blood in bowel movements  (Exception: Blood on surface of BM with constipation)  Answer Assessment - Initial Assessment Questions 1. LOCATION: "Where does it hurt?"      Midline 2. RADIATION: "Does the pain shoot anywhere else?" (e.g., chest, back)     No 3. ONSET: "When did the pain begin?" (Minutes, hours or days ago)      1 week ago 4. SUDDEN: "Gradual or sudden onset?"     Gradual 5. PATTERN "Does the pain come and go, or is it constant?"    - If constant: "Is it getting better, staying the same, or worsening?"      (Note: Constant means the pain never goes away completely; most serious pain is constant and it progresses)     - If intermittent: "How long does it last?" "Do you have pain now?"     (Note: Intermittent means the pain goes away completely between bouts)     Comes and goes 6. SEVERITY: "How bad is the pain?"  (e.g., Scale 1-10; mild, moderate, or severe)    - MILD (1-3): doesn't interfere with normal activities, abdomen soft and not tender to touch     - MODERATE (4-7): interferes with normal activities or awakens from sleep, abdomen tender to touch     - SEVERE (8-10): excruciating pain, doubled over, unable to do any normal activities       Now - none 7. RECURRENT SYMPTOM: "Have you ever had this type of stomach pain before?" If Yes, ask: "When was the last time?" and "What happened that time?"      No 8. CAUSE: "What do you think is causing the stomach pain?"     Unsure 9. RELIEVING/AGGRAVATING FACTORS: "What makes it better or worse?" (e.g., movement, antacids,  bowel movement)     No 10. OTHER SYMPTOMS: "Do you have any other symptoms?" (e.g., back pain, diarrhea, fever, urination pain, vomiting)       Black stool  Protocols used: Abdominal Pain - Male-A-AH

## 2021-12-30 NOTE — Progress Notes (Signed)
Erroneous encounter-disregard

## 2022-01-06 ENCOUNTER — Encounter: Payer: Self-pay | Admitting: Family

## 2022-01-08 ENCOUNTER — Emergency Department (HOSPITAL_COMMUNITY)
Admission: EM | Admit: 2022-01-08 | Discharge: 2022-01-08 | Disposition: A | Discharge status: Home / Self Care | Payer: Medicaid Other | Attending: Emergency Medicine | Admitting: Emergency Medicine

## 2022-01-08 ENCOUNTER — Other Ambulatory Visit: Payer: Self-pay

## 2022-01-08 ENCOUNTER — Encounter (HOSPITAL_COMMUNITY): Payer: Self-pay

## 2022-01-08 ENCOUNTER — Emergency Department (HOSPITAL_COMMUNITY): Payer: Medicaid Other

## 2022-01-08 DIAGNOSIS — R0602 Shortness of breath: Secondary | ICD-10-CM | POA: Insufficient documentation

## 2022-01-08 DIAGNOSIS — R5383 Other fatigue: Secondary | ICD-10-CM | POA: Insufficient documentation

## 2022-01-08 DIAGNOSIS — Z7982 Long term (current) use of aspirin: Secondary | ICD-10-CM | POA: Insufficient documentation

## 2022-01-08 DIAGNOSIS — Z91148 Patient's other noncompliance with medication regimen for other reason: Secondary | ICD-10-CM | POA: Insufficient documentation

## 2022-01-08 DIAGNOSIS — Z79899 Other long term (current) drug therapy: Secondary | ICD-10-CM | POA: Insufficient documentation

## 2022-01-08 DIAGNOSIS — Z7901 Long term (current) use of anticoagulants: Secondary | ICD-10-CM | POA: Insufficient documentation

## 2022-01-08 DIAGNOSIS — I483 Typical atrial flutter: Secondary | ICD-10-CM | POA: Insufficient documentation

## 2022-01-08 DIAGNOSIS — R079 Chest pain, unspecified: Secondary | ICD-10-CM | POA: Insufficient documentation

## 2022-01-08 DIAGNOSIS — R2243 Localized swelling, mass and lump, lower limb, bilateral: Secondary | ICD-10-CM | POA: Insufficient documentation

## 2022-01-08 DIAGNOSIS — R109 Unspecified abdominal pain: Secondary | ICD-10-CM | POA: Insufficient documentation

## 2022-01-08 DIAGNOSIS — R42 Dizziness and giddiness: Secondary | ICD-10-CM | POA: Insufficient documentation

## 2022-01-08 LAB — CBC WITH DIFFERENTIAL/PLATELET
Abs Immature Granulocytes: 0.05 10*3/uL (ref 0.00–0.07)
Basophils Absolute: 0.1 10*3/uL (ref 0.0–0.1)
Basophils Relative: 1 %
Eosinophils Absolute: 0.1 10*3/uL (ref 0.0–0.5)
Eosinophils Relative: 1 %
HCT: 29 % — ABNORMAL LOW (ref 39.0–52.0)
Hemoglobin: 9.1 g/dL — ABNORMAL LOW (ref 13.0–17.0)
Immature Granulocytes: 1 %
Lymphocytes Relative: 18 %
Lymphs Abs: 1.2 10*3/uL (ref 0.7–4.0)
MCH: 32.4 pg (ref 26.0–34.0)
MCHC: 31.4 g/dL (ref 30.0–36.0)
MCV: 103.2 fL — ABNORMAL HIGH (ref 80.0–100.0)
Monocytes Absolute: 0.7 10*3/uL (ref 0.1–1.0)
Monocytes Relative: 11 %
Neutro Abs: 4.9 10*3/uL (ref 1.7–7.7)
Neutrophils Relative %: 68 %
Platelets: 166 10*3/uL (ref 150–400)
RBC: 2.81 MIL/uL — ABNORMAL LOW (ref 4.22–5.81)
RDW: 21 % — ABNORMAL HIGH (ref 11.5–15.5)
WBC: 7 10*3/uL (ref 4.0–10.5)
nRBC: 0.3 % — ABNORMAL HIGH (ref 0.0–0.2)

## 2022-01-08 LAB — COMPREHENSIVE METABOLIC PANEL
ALT: 32 U/L (ref 0–44)
AST: 58 U/L — ABNORMAL HIGH (ref 15–41)
Albumin: 2.8 g/dL — ABNORMAL LOW (ref 3.5–5.0)
Alkaline Phosphatase: 228 U/L — ABNORMAL HIGH (ref 38–126)
Anion gap: 11 (ref 5–15)
BUN: 6 mg/dL (ref 6–20)
CO2: 22 mmol/L (ref 22–32)
Calcium: 8.6 mg/dL — ABNORMAL LOW (ref 8.9–10.3)
Chloride: 105 mmol/L (ref 98–111)
Creatinine, Ser: 0.93 mg/dL (ref 0.61–1.24)
GFR, Estimated: >60 mL/min (ref >60)
Glucose, Bld: 114 mg/dL — ABNORMAL HIGH (ref 70–99)
Potassium: 3.5 mmol/L (ref 3.5–5.1)
Sodium: 138 mmol/L (ref 135–145)
Total Bilirubin: 1.3 mg/dL — ABNORMAL HIGH (ref 0.3–1.2)
Total Protein: 7.3 g/dL (ref 6.5–8.1)

## 2022-01-08 LAB — URINALYSIS, ROUTINE W REFLEX MICROSCOPIC
Bilirubin Urine: NEGATIVE
Glucose, UA: NEGATIVE mg/dL
Hgb urine dipstick: NEGATIVE
Ketones, ur: NEGATIVE mg/dL
Leukocytes,Ua: NEGATIVE
Nitrite: NEGATIVE
Protein, ur: NEGATIVE mg/dL
Specific Gravity, Urine: 1.006 (ref 1.005–1.030)
pH: 6 (ref 5.0–8.0)

## 2022-01-08 LAB — BRAIN NATRIURETIC PEPTIDE: B Natriuretic Peptide: 140.4 pg/mL — ABNORMAL HIGH (ref 0.0–100.0)

## 2022-01-08 LAB — ETHANOL: Alcohol, Ethyl (B): <10 mg/dL (ref <10)

## 2022-01-08 LAB — POC OCCULT BLOOD, ED: Fecal Occult Bld: NEGATIVE

## 2022-01-08 LAB — TROPONIN I (HIGH SENSITIVITY)
Troponin I (High Sensitivity): 11 ng/L (ref <18)
Troponin I (High Sensitivity): 9 ng/L (ref <18)

## 2022-01-08 MED ORDER — FERROUS SULFATE 325 (65 FE) MG PO TABS: 325.0000 mg | ORAL_TABLET | Freq: Every day | ORAL | 0 refills | Status: AC

## 2022-01-08 MED ORDER — FUROSEMIDE 40 MG PO TABS: 40.0000 mg | ORAL_TABLET | Freq: Every day | ORAL | 0 refills | Status: AC

## 2022-01-08 MED ORDER — AMIODARONE HCL 200 MG PO TABS
200.0000 mg | ORAL_TABLET | Freq: Two times a day (BID) | ORAL | 0 refills | Status: AC
Start: 2022-01-08 — End: 2022-02-07

## 2022-01-08 MED ORDER — METOPROLOL TARTRATE 25 MG PO TABS: 25.0000 mg | ORAL_TABLET | Freq: Two times a day (BID) | ORAL | 0 refills | Status: AC

## 2022-01-08 MED ORDER — ASPIRIN 81 MG PO CHEW: 81.0000 mg | CHEWABLE_TABLET | Freq: Every day | ORAL | 0 refills | Status: AC

## 2022-01-08 NOTE — ED Provider Triage Note (Signed)
Emergency Medicine Provider Triage Evaluation Note  Mark Lawrence , a 56 y.o. male  was evaluated in triage.  Pt complains of feeling dizzy at times, out of his amiodarone and metoprolol (as well as other meds) for 1 month. Lower extremity edema, SHOB at times, with exertion.   Review of Systems  Positive: Dizziness, leg swelling Negative: Fever, CP, palpitations   Physical Exam  BP (!) 129/93 (BP Location: Right Arm)   Pulse 100   Temp 98.7 F (37.1 C) (Oral)   Ht 5\' 11"  (1.803 m)   Wt 72 kg   SpO2 99%   BMI 22.14 kg/m  Gen:   Awake, no distress   Resp:  Normal effort  MSK:   Moves extremities without difficulty  Other:  Pitting edema bilateral lower extremities  Medical Decision Making  Medically screening exam initiated at 8:25 AM.  Appropriate orders placed.  Mark Lawrence was informed that the remainder of the evaluation will be completed by another provider, this initial triage assessment does not replace that evaluation, and the importance of remaining in the ED until their evaluation is complete.     Gaspar Garbe, PA-C 01/08/22 [phone]

## 2022-01-08 NOTE — ED Notes (Signed)
Received verbal report from Lindsey B RN at this time 

## 2022-01-08 NOTE — ED Notes (Signed)
Pt oxygen 97% while ambulated to bathroom

## 2022-01-08 NOTE — Discharge Instructions (Addendum)
It was a pleasure caring for you today in the emergency department.  Please return to the emergency department for any worsening or worrisome symptoms.  Please take your home medications as prescribed.   The [facility] pharmacy has your prescriptions available for 4 dollars a piece.  That is the cheapest price available in this area.

## 2022-01-08 NOTE — ED Triage Notes (Signed)
Pt arrives POV for eval of dizziness and edema Pt reports he was dx'd w/ a flutter in October, started on amiodarone/metoprolol/lasix at that time. Pt reports he has run out of all of the above for 1 month. Reports worsening lower extremity edema, as well as dizziness since running out of his medications

## 2022-01-08 NOTE — ED Provider Notes (Addendum)
56 year old male to the ED secondary to fatigue, lower extremity swelling, feeling lightheaded over the past 2 months.  See note from PA earlier today.  Patient reports he ran out of his medications around 2 months ago which is when his symptoms began to occur.  He attributes this to cost issues.    On reassessment the patient has no acute complaints and reports he is feeling much better.  Ambulatory with steady gait.  No hypoxia on exertion.  Labs in the Emergency Department are stable, comparable to his baseline.  Chest x-ray is nonacute.  No evidence of ACS at this time.  No chest pain or dyspnea currently.  Cardiac monitoring without evidence of arrhythmia.  Normal sinus rhythm.   Does not appear to be in acute CHF exacerbation or volume overload at this time.  Neurologically is intact.  No focal deficits on exam.  He is speaking clearly in full sentences.  GCS 15, AO x4.  Symptoms are vague, does not appear to be an acute life-threatening etiology to his complaints today.   He is being given refills on his home medications and discussed low cost options for acquiring his medications.  CHA2DS2-VASc score is 1.  He is unable to afford Eliquis.  He has been unable to follow-up with cardiology secondary to transportation issues.  Recommend patient start low-dose aspirin as he is unable to acquire Eliquis.  If he is able to acquire the Eliquis I would recommend he switch back to Eliquis instead of the aspirin.  Ambulatory referral to cardiology was placed.  I advised patient follow-up with his PCP at the internal medicine clinic.   Symptoms today likely attributed to medication noncompliance due to presumed cost issues.  Advised him to resume his medications.  Follow up w/ PCP, follow-up w/ cardiology.  Strict return precautions were discussed.  The patient improved significantly and was discharged in stable condition. Detailed discussions were had with the patient regarding current findings, and  need for close f/u with PCP or on call doctor. The patient has been instructed to return immediately if the symptoms worsen in any way for re-evaluation. Patient verbalized understanding and is in agreement with current care plan. All questions answered prior to discharge.   EKG Interpretation  Date/Time:  Friday January 08 2022 08:19:04 EDT Ventricular Rate:  88 PR Interval:  154 QRS Duration: 82 QT Interval:  364 QTC Calculation: 440 R Axis:   -17 Text Interpretation: Sinus rhythm with Premature supraventricular complexes Minimal voltage criteria for LVH, may be normal variant ( R in aVL ) Nonspecific T wave abnormality Abnormal ECG When compared with ECG of 30-Apr-2021 10:39, PREVIOUS ECG IS PRESENT no stemi Confirmed by Tanda Rockers ([id number]) on 01/08/2022 7:18:13 PM          Sloan Leiter, DO 01/08/22 2354    Sloan Leiter, DO 01/08/22 2354

## 2022-01-08 NOTE — ED Provider Notes (Signed)
[facility] EMERGENCY DEPARTMENT Provider Note   CSN: [id number] Arrival date & time: 01/08/22  0815     History  Chief Complaint  Patient presents with   Dizziness    Mark Lawrence is a 56 y.o. male.   Dizziness  Patient is a 56 year old male with a past medical history significant for a flutter, lower extremity edema, iron deficiency  Patient is presented to the emergency room today states that he has felt somewhat fatigued had lower extremity edema and more lightheaded over the past 2 months since he stopped taking his medication since he ran out of them.  He states that he occasionally feels very short of breath especially with exertion.  He does not notice it as much when he is laying down but does state that he feels somewhat short of breath progressively worsening over the past 2 months.  He states he occasionally has episodes of chest pain no chest pain currently.  He endorses some diffuse achy abdominal pain no nausea or vomiting.  He states that at times he feels that his lightheadedness is severe enough to make him feel that he is about to pass out     Home Medications Prior to Admission medications   Medication Sig Start Date End Date Taking? Authorizing Provider  aspirin 81 MG chewable tablet Chew 1 tablet (81 mg total) by mouth daily. 01/08/22 02/07/22 Yes Sloan Leiter, DO  amiodarone (PACERONE) 200 MG tablet Take 1 tablet (200 mg total) by mouth 2 (two) times daily. 01/08/22 02/07/22  Sloan Leiter, DO  apixaban (ELIQUIS) 5 MG TABS tablet Take 1 tablet (5 mg total) by mouth 2 (two) times daily. 04/16/21 05/16/21  Alvira Monday, MD  ferrous sulfate 325 (65 FE) MG tablet Take 1 tablet (325 mg total) by mouth daily. 01/08/22 02/07/22  Sloan Leiter, DO  furosemide (LASIX) 40 MG tablet Take 1 tablet (40 mg total) by mouth daily. 01/08/22 02/07/22  Tanda Rockers A, DO  metoprolol tartrate (LOPRESSOR) 25 MG tablet Take 1 tablet (25 mg total) by mouth 2  (two) times daily. Do not take if your heart rate is less than 60 beats per minute or if the top number of your blood pressure is less than 100. 01/08/22 02/07/22  Tanda Rockers A, DO  vitamin C (ASCORBIC ACID) 500 MG tablet Take 500 mg by mouth daily.    [provider]      Allergies    Patient has no known allergies.    Review of Systems   Review of Systems  Neurological:  Positive for dizziness.    Physical Exam Updated Vital Signs BP 109/77   Pulse 77   Temp 98.9 F (37.2 C)   Resp 17   Ht 5\' 11"  (1.803 m)   Wt 72 kg   SpO2 98%   BMI 22.14 kg/m  Physical Exam Vitals and nursing note reviewed.  Constitutional:      General: He is not in acute distress. HENT:     Head: Normocephalic and atraumatic.     Nose: Nose normal.     Mouth/Throat:     Mouth: Mucous membranes are moist.  Eyes:     General: No scleral icterus. Neck:     Comments: Faint JVP Cardiovascular:     Rate and Rhythm: Normal rate and regular rhythm.     Pulses: Normal pulses.     Heart sounds: Normal heart sounds.  Pulmonary:     Effort: Pulmonary effort  is normal. No respiratory distress.     Breath sounds: No wheezing.  Abdominal:     Palpations: Abdomen is soft.     Tenderness: There is no abdominal tenderness. There is no guarding or rebound.  Musculoskeletal:     Cervical back: Normal range of motion.     Right lower leg: Edema present.     Left lower leg: Edema present.  Skin:    General: Skin is warm and dry.     Capillary Refill: Capillary refill takes less than 2 seconds.  Neurological:     Mental Status: He is alert. Mental status is at baseline.  Psychiatric:        Mood and Affect: Mood normal.        Behavior: Behavior normal.     ED Results / Procedures / Treatments   Labs (all labs ordered are listed, but only abnormal results are displayed) Labs Reviewed  CBC WITH DIFFERENTIAL/PLATELET - Abnormal; Notable for the following components:      Result Value   RBC  2.81 (*)    Hemoglobin 9.1 (*)    HCT 29.0 (*)    MCV 103.2 (*)    RDW 21.0 (*)    nRBC 0.3 (*)    All other components within normal limits  BRAIN NATRIURETIC PEPTIDE - Abnormal; Notable for the following components:   B Natriuretic Peptide 140.4 (*)    All other components within normal limits  COMPREHENSIVE METABOLIC PANEL - Abnormal; Notable for the following components:   Glucose, Bld 114 (*)    Calcium 8.6 (*)    Albumin 2.8 (*)    AST 58 (*)    Alkaline Phosphatase 228 (*)    Total Bilirubin 1.3 (*)    All other components within normal limits  ETHANOL  URINALYSIS, ROUTINE W REFLEX MICROSCOPIC  POC OCCULT BLOOD, ED  TROPONIN I (HIGH SENSITIVITY)  TROPONIN I (HIGH SENSITIVITY)    EKG EKG Interpretation  Date/Time:  Friday January 08 2022 08:19:04 EDT Ventricular Rate:  88 PR Interval:  154 QRS Duration: 82 QT Interval:  364 QTC Calculation: 440 R Axis:   -17 Text Interpretation: Sinus rhythm with Premature supraventricular complexes Minimal voltage criteria for LVH, may be normal variant ( R in aVL ) Nonspecific T wave abnormality Abnormal ECG When compared with ECG of 30-Apr-2021 10:39, PREVIOUS ECG IS PRESENT no stemi Confirmed by Tanda Rockers ([id number]) on 01/08/2022 7:18:13 PM  Radiology DG Chest 2 View  Result Date: 01/08/2022 CLINICAL DATA:  Shortness of breath. EXAM: CHEST - 2 VIEW COMPARISON:  Chest radiograph April 16, 2021. FINDINGS: Similar mild scarring in the lingula. No consolidation. No visible pleural effusions or pneumothorax. Cardiomediastinal silhouette is within normal limits and unchanged. No acute osseous abnormality. IMPRESSION: No acute abnormality.  Similar chronic mild scarring in the lingula. Electronically Signed   By: Feliberto Harts M.D.   On: 01/08/2022 08:45    Procedures Procedures    Medications Ordered in ED Medications - No data to display  ED Course/ Medical Decision Making/ A&P Clinical Course as of 01/09/22 1206  Fri Jan 08, 2022  1747 [facility] for 2 months. More with walking and standing.  Occasional episodes of CP. Diffuse abd pain. Achy in general.   Near syncope.  [WF]    Clinical Course User Index [WF] Gailen Shelter, [place]  Medical Decision Making Amount and/or Complexity of Data Reviewed Labs: ordered.  Risk OTC drugs. Prescription drug management.   This patient presents to the ED for concern of light headedness for two months, this involves a number of treatment options, and is a complaint that carries with it a moderate risk of complications and morbidity.  The differential diagnosis includes The differential diagnosis of weakness includes but is not limited to neurologic causes (GBS, myasthenia gravis, CVA, MS, ALS, transverse myelitis, spinal cord injury, CVA, botulism, ) and other causes: ACS, Arrhythmia, syncope, orthostatic hypotension, sepsis, hypoglycemia, electrolyte disturbance, hypothyroidism, respiratory failure, symptomatic anemia, dehydration, heat injury, polypharmacy, malignancy.  Given the patient's presentation history and physical exam I feel that most likely his symptoms are related to not taking his medications and gradual worsening of his disease.  Co morbidities: Discussed in HPI   Brief History:  Patient is a 56 year old male with a past medical history significant for a flutter, lower extremity edema, iron deficiency  Patient is presented to the emergency room today states that he has felt somewhat fatigued had lower extremity edema and more lightheaded over the past 2 months since he stopped taking his medication since he ran out of them.  He states that he occasionally feels very short of breath especially with exertion.  He does not notice it as much when he is laying down but does state that he feels somewhat short of breath progressively worsening over the past 2 months.  He states he occasionally has episodes of chest pain no chest pain currently.   He endorses some diffuse achy abdominal pain no nausea or vomiting.  He states that at times he feels that his lightheadedness is severe enough to make him feel that he is about to pass out    EMR reviewed including pt PMHx, past surgical history and past visits to ER.   See HPI for more details   Lab Tests:   I ordered and independently interpreted labs. Labs notable for marginally elevated BNP at 140, urinalysis unremarkable, ethanol undetectable, CMP without any acute abnormality.  CBC with anemia that seems to be stable over the past 8 months.   Imaging Studies:  NAD. I personally reviewed all imaging studies and no acute abnormality found. I agree with radiology interpretation.  No evidence of pulmonary edema or pneumonia  Cardiac Monitoring:  The patient was maintained on a cardiac monitor.  I personally viewed and interpreted the cardiac monitored which showed an underlying rhythm of: NSR EKG non-ischemic NSR   Medicines ordered: No medications   Critical Interventions:     Consults/Attending Physician   I discussed this case with my attending physician who cosigned this note including patient's presenting symptoms, physical exam, and planned diagnostics and interventions. Attending physician stated agreement with plan or made changes to plan which were implemented.   Reevaluation:  After the interventions noted above I re-evaluated patient and found that they have :stayed the same   Social Determinants of Health:  Social work/case management involved consult placed to social work for medication assistance    Problem List / ED Course:  Lightheadedness, fatigue, dyspnea occasionally.  Reassuring work-up so far.  We will add troponins on given his exertional dyspnea.  Ultimately low suspicion for acute severe disease.  Suspect his symptoms are related to noncompliance of his medications.   Dispostion:  Patient care handed off to Dr. Wallace Cullens for  reassessment.  Ultimately patient most likely benefit from follow-up outpatient cardiology.  Final Clinical Impression(s) / ED Diagnoses Final diagnoses:  Non compliance w medication regimen  Typical atrial flutter (HCC)    Rx / DC Orders ED Discharge Orders          Ordered    amiodarone (PACERONE) 200 MG tablet  2 times daily        01/08/22 2249    ferrous sulfate 325 (65 FE) MG tablet  Daily        01/08/22 2249    furosemide (LASIX) 40 MG tablet  Daily        01/08/22 2249    metoprolol tartrate (LOPRESSOR) 25 MG tablet  2 times daily        01/08/22 2249    Ambulatory referral to Cardiology        01/08/22 2249    aspirin 81 MG chewable tablet  Daily        01/08/22 2249              Gailen Shelter, [place] 01/09/22 1206    Tanda Rockers A, DO 01/11/22 1622

## 2022-01-09 NOTE — Care Management (Addendum)
Chart reviewed received consult regarding medication and inability to pay. MATCH done and called to [facility] pharmacy where medications were sent.   Name: Mark Lawrence ID [id number] Bin: [id number] Rx Group: [id number] Called patient, voice mail hjas not been set up, texted to call this RNCM back

## 2022-03-09 ENCOUNTER — Ambulatory Visit: Payer: Self-pay | Attending: Interventional Cardiology | Admitting: Interventional Cardiology

## 2023-01-30 IMAGING — CT CT ANGIO CHEST
4 of 7 series · 16 of 36 positions shown · IV contrast (omnipaque)
Comparison: Same day radiograph

CLINICAL DATA: PE suspected, high prob

EXAM:
CT ANGIOGRAPHY CHEST WITH CONTRAST
TECHNIQUE: Multidetector CT imaging of the chest was performed using the
standard protocol during bolus administration of intravenous
contrast. Multiplanar CT image reconstructions and MIPs were
obtained to evaluate the vascular anatomy.
CONTRAST:  50mL OMNIPAQUE IOHEXOL 350 MG/ML SOLN

[Series 6: pe 2mm · axial · 0.84mm/px · z∈[+1264,+1444]mm · 4 of 152 slices shown]
[im 31/152  lung]
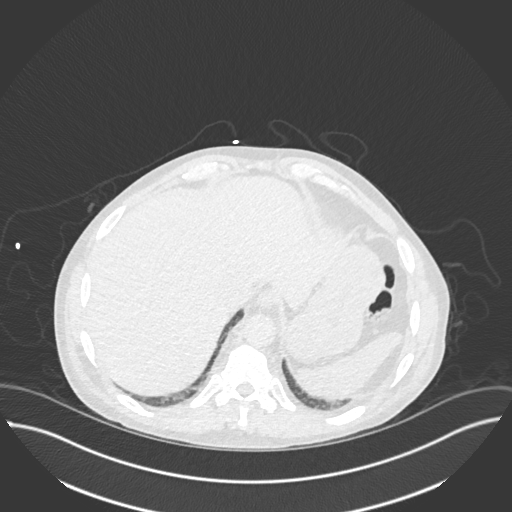
[im 61/152  mediastinal]
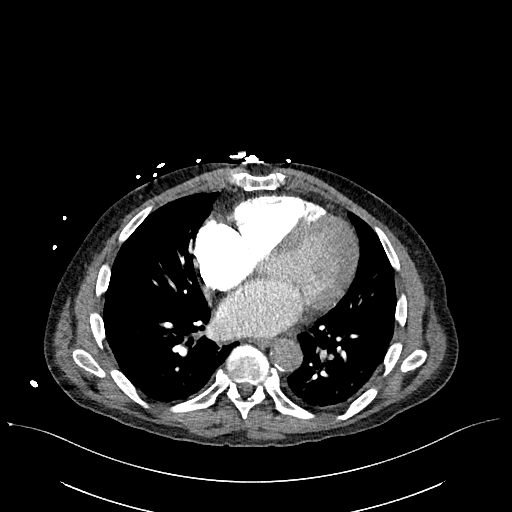
[im 91/152  lung]
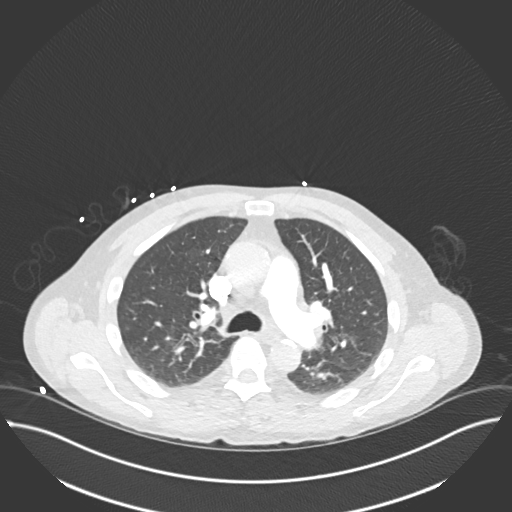
[im 121/152  mediastinal]
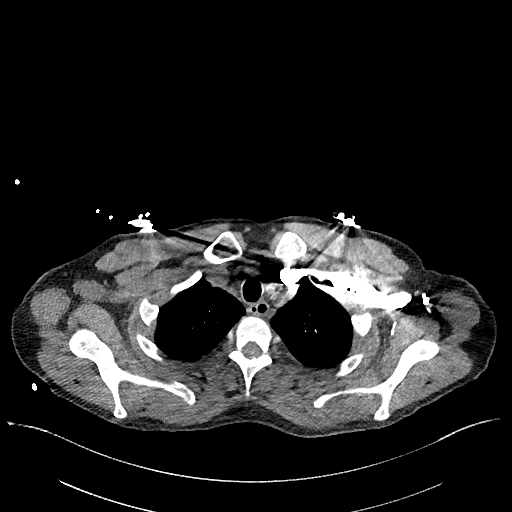

[Series 7: pe lung · axial · 0.84mm/px · z∈[+1304,+1438]mm · 3 of 135 slices shown]
[im 34/135  mediastinal]
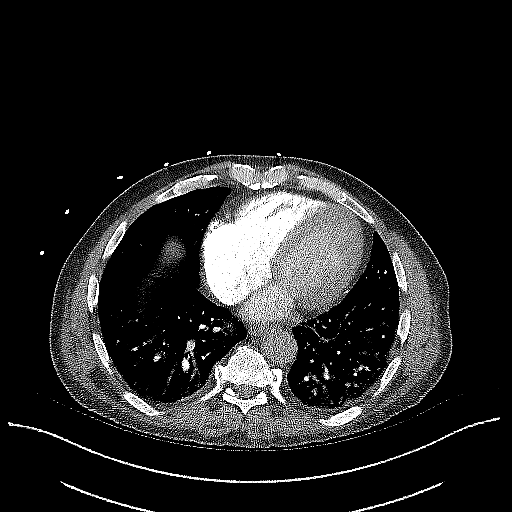
[im 68/135  mediastinal]
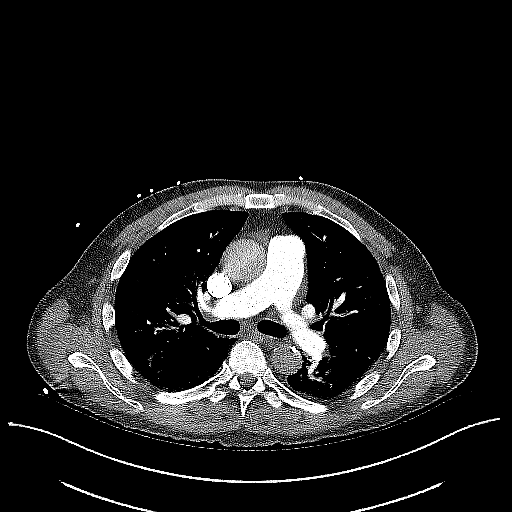
[im 101/135  mediastinal]
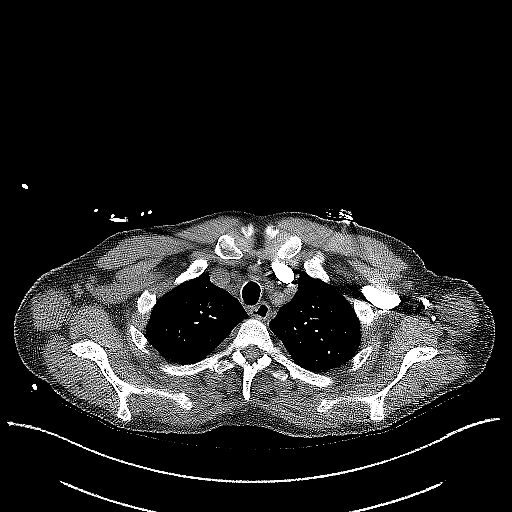

[Series 8: pe thins · axial · 0.84mm/px · z∈[+1229,+1488]mm · 8 of 423 slices shown]
[im 27/423  lung]
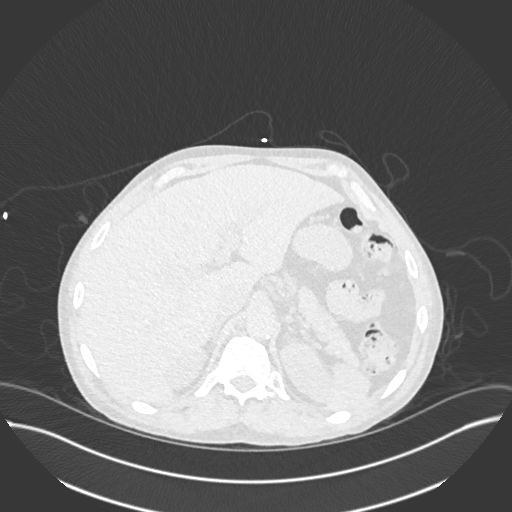
[im 80/423  lung]
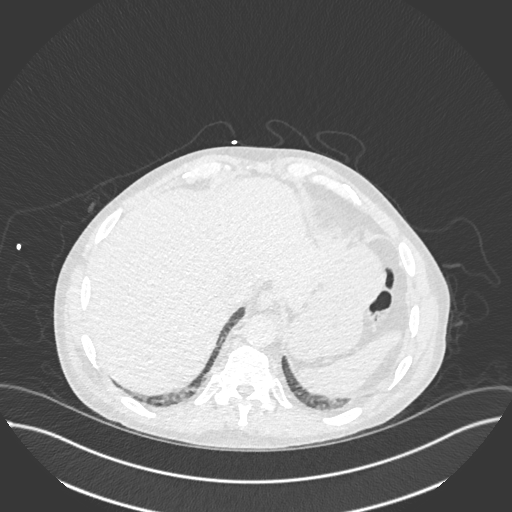
[im 132/423  lung]
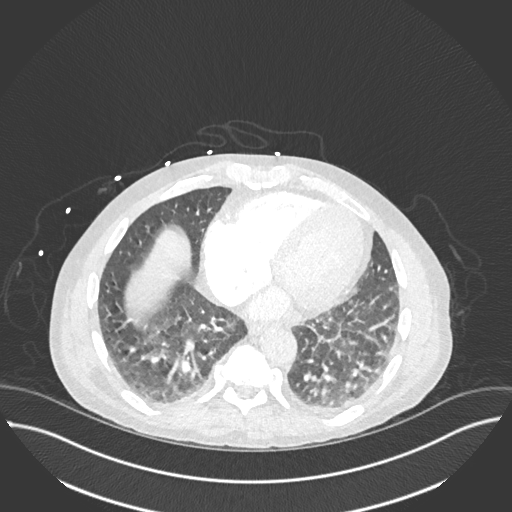
[im 185/423  lung]
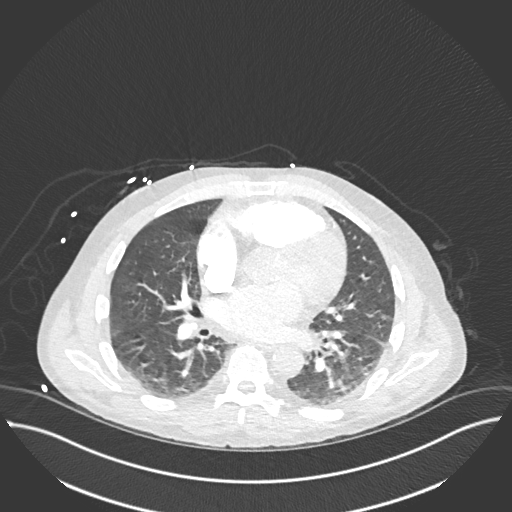
[im 238/423  lung]
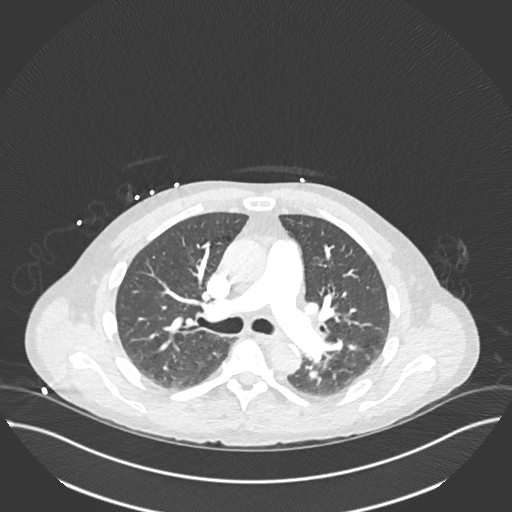
[im 291/423  lung]
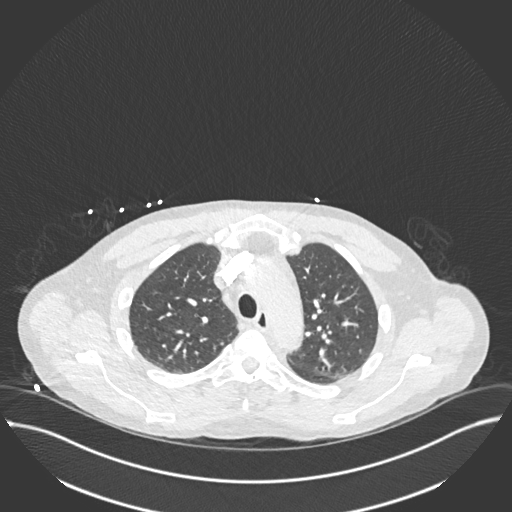
[im 343/423  lung]
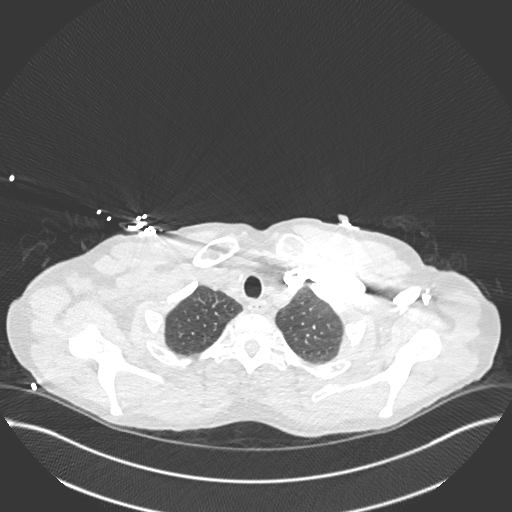
[im 396/423  lung]
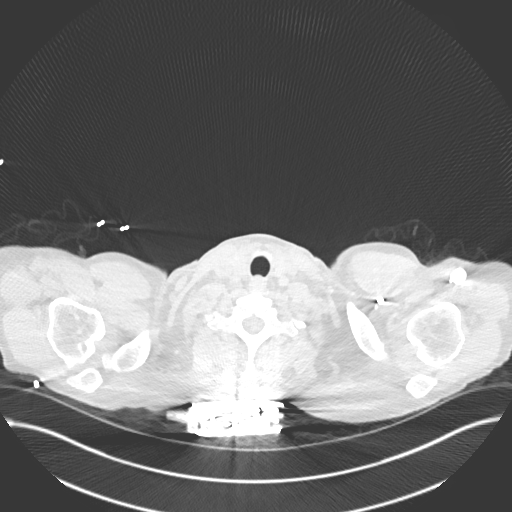

[Series 9: pe 2mm cor · coronal · 0.59mm/px · 1 of 122 slices shown]
[im 61/122  mediastinal]
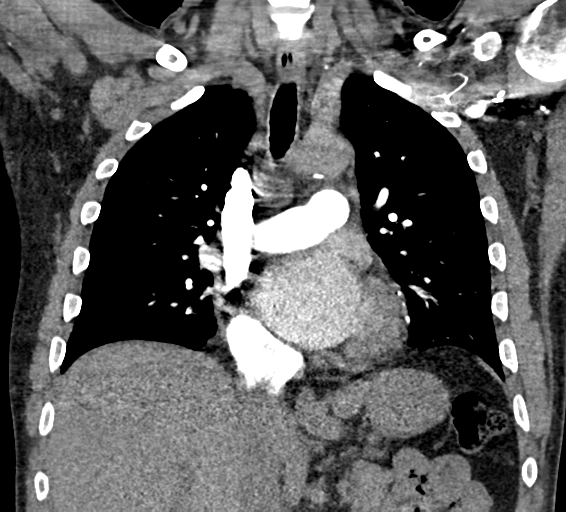

[16 of 36 positions shown; findings below may reference images not displayed]

FINDINGS: Cardiovascular: Borderline enlarged heart.No pericardial
disease.Normal size main and branch pulmonary arteries.Moderate
atherosclerotic calcifications of the thoracic aorta. Mild coronary
artery calcifications.

Mediastinum/Nodes: No lymphadenopathy.The thyroid is
unremarkable.Esophagus is unremarkable.The trachea is unremarkable.

Lungs/Pleura: Bibasilar hypoventilatory changes. Bibasilar
scarring/linear atelectasis. Diffuse mild bronchial wall thickening.
Linear atelectasis/scarring in the lingula.No suspicious pulmonary
nodules or masses.No pleural effusion.No pneumothorax.

Upper Abdomen: No acute abnormality.

Musculoskeletal: No acute osseous abnormality.No suspicious lytic or
blastic lesions. There is a subcutaneous cystic lesion along the
right shoulder, likely sebaceous cyst measuring 1.2 cm (series 6,
image 21).

Review of the MIP images confirms the above findings.
IMPRESSION: No pulmonary embolism or other acute findings in the chest.
